# Patient Record
Sex: Male | Born: 1935 | Race: White | Hispanic: No | Marital: Married | Smoking: Former smoker
Health system: Southern US, Community
[De-identification: ages and names within clinical notes are randomized; demographics above are authoritative.]

## PROBLEM LIST (undated history)

## (undated) DIAGNOSIS — I340 Nonrheumatic mitral (valve) insufficiency: Secondary | ICD-10-CM

## (undated) DIAGNOSIS — N390 Urinary tract infection, site not specified: Secondary | ICD-10-CM

## (undated) DIAGNOSIS — A6920 Lyme disease, unspecified: Secondary | ICD-10-CM

## (undated) DIAGNOSIS — B019 Varicella without complication: Secondary | ICD-10-CM

## (undated) DIAGNOSIS — C4492 Squamous cell carcinoma of skin, unspecified: Secondary | ICD-10-CM

## (undated) DIAGNOSIS — Z9889 Other specified postprocedural states: Secondary | ICD-10-CM

## (undated) DIAGNOSIS — R32 Unspecified urinary incontinence: Secondary | ICD-10-CM

## (undated) DIAGNOSIS — C4491 Basal cell carcinoma of skin, unspecified: Secondary | ICD-10-CM

## (undated) DIAGNOSIS — C801 Malignant (primary) neoplasm, unspecified: Secondary | ICD-10-CM

## (undated) DIAGNOSIS — C61 Malignant neoplasm of prostate: Secondary | ICD-10-CM

## (undated) DIAGNOSIS — Z5189 Encounter for other specified aftercare: Secondary | ICD-10-CM

## (undated) HISTORY — DX: Squamous cell carcinoma of skin, unspecified: C44.92

## (undated) HISTORY — DX: Varicella without complication: B01.9

## (undated) HISTORY — DX: Lyme disease, unspecified: A69.20

## (undated) HISTORY — DX: Basal cell carcinoma of skin, unspecified: C44.91

## (undated) HISTORY — DX: Nonrheumatic mitral (valve) insufficiency: I34.0

## (undated) HISTORY — DX: Encounter for other specified aftercare: Z51.89

## (undated) HISTORY — DX: Unspecified urinary incontinence: R32

## (undated) HISTORY — DX: Urinary tract infection, site not specified: N39.0

## (undated) HISTORY — DX: Other specified postprocedural states: Z98.890

## (undated) HISTORY — DX: Malignant neoplasm of prostate: C61

## (undated) HISTORY — DX: Malignant (primary) neoplasm, unspecified: C80.1

---
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
## 1968-01-23 HISTORY — PX: INGUINAL HERNIA REPAIR: SUR1180

## 1978-01-22 HISTORY — PX: BLEPHAROPLASTY: SUR158

## 2010-01-22 DIAGNOSIS — A6920 Lyme disease, unspecified: Secondary | ICD-10-CM

## 2010-01-22 HISTORY — DX: Lyme disease, unspecified: A69.20

## 2011-03-20 ENCOUNTER — Emergency Department: Payer: Self-pay | Admitting: Emergency Medicine

## 2011-03-20 LAB — CBC
HCT: 25 % — ABNORMAL LOW (ref 40.0–52.0)
MCHC: 34.2 g/dL (ref 32.0–36.0)
MCV: 102 fL — ABNORMAL HIGH (ref 80–100)
RBC: 2.47 10*6/uL — ABNORMAL LOW (ref 4.40–5.90)
RDW: 14.2 % (ref 11.5–14.5)
WBC: 11 10*3/uL — ABNORMAL HIGH (ref 3.8–10.6)

## 2011-03-20 LAB — BASIC METABOLIC PANEL
Anion Gap: 9 (ref 7–16)
BUN: 27 mg/dL — ABNORMAL HIGH (ref 7–18)
Creatinine: 1.4 mg/dL — ABNORMAL HIGH (ref 0.60–1.30)
EGFR (Non-African Amer.): 51 — ABNORMAL LOW
Glucose: 136 mg/dL — ABNORMAL HIGH (ref 65–99)
Osmolality: 277 (ref 275–301)

## 2011-03-20 LAB — URINALYSIS, COMPLETE

## 2011-03-20 LAB — PROTIME-INR: INR: 1

## 2011-04-24 DIAGNOSIS — I059 Rheumatic mitral valve disease, unspecified: Secondary | ICD-10-CM

## 2011-04-24 DIAGNOSIS — D698 Other specified hemorrhagic conditions: Secondary | ICD-10-CM

## 2011-04-24 DIAGNOSIS — R413 Other amnesia: Secondary | ICD-10-CM

## 2011-06-05 DIAGNOSIS — R05 Cough: Secondary | ICD-10-CM

## 2011-06-05 DIAGNOSIS — I059 Rheumatic mitral valve disease, unspecified: Secondary | ICD-10-CM

## 2011-06-05 DIAGNOSIS — N3289 Other specified disorders of bladder: Secondary | ICD-10-CM

## 2011-10-06 ENCOUNTER — Emergency Department: Payer: Self-pay | Admitting: Emergency Medicine

## 2011-10-06 LAB — COMPREHENSIVE METABOLIC PANEL
Alkaline Phosphatase: 112 U/L (ref 50–136)
BUN: 37 mg/dL — ABNORMAL HIGH (ref 7–18)
Chloride: 104 mmol/L (ref 98–107)
Co2: 26 mmol/L (ref 21–32)
EGFR (African American): 31 — ABNORMAL LOW
EGFR (Non-African Amer.): 27 — ABNORMAL LOW
Glucose: 125 mg/dL — ABNORMAL HIGH (ref 65–99)
SGOT(AST): 44 U/L — ABNORMAL HIGH (ref 15–37)
SGPT (ALT): 24 U/L (ref 12–78)
Total Protein: 8.1 g/dL (ref 6.4–8.2)

## 2011-10-06 LAB — PROTIME-INR
INR: 1
Prothrombin Time: 13.4 secs (ref 11.5–14.7)

## 2011-10-06 LAB — MAGNESIUM: Magnesium: 1.9 mg/dL

## 2011-10-06 LAB — CBC
HGB: 13 g/dL (ref 13.0–18.0)
MCH: 31.4 pg (ref 26.0–34.0)
MCHC: 33.4 g/dL (ref 32.0–36.0)
MCV: 94 fL (ref 80–100)
Platelet: 266 10*3/uL (ref 150–440)
RBC: 4.13 10*6/uL — ABNORMAL LOW (ref 4.40–5.90)

## 2011-10-06 LAB — APTT: Activated PTT: 27.2 secs (ref 23.6–35.9)

## 2011-10-08 LAB — URINE CULTURE

## 2011-10-12 LAB — CULTURE, BLOOD (SINGLE)

## 2011-10-15 DIAGNOSIS — C61 Malignant neoplasm of prostate: Secondary | ICD-10-CM

## 2011-10-15 DIAGNOSIS — N139 Obstructive and reflux uropathy, unspecified: Secondary | ICD-10-CM

## 2011-10-15 DIAGNOSIS — A419 Sepsis, unspecified organism: Secondary | ICD-10-CM

## 2011-10-15 DIAGNOSIS — I059 Rheumatic mitral valve disease, unspecified: Secondary | ICD-10-CM

## 2011-11-07 ENCOUNTER — Ambulatory Visit: Payer: Self-pay | Admitting: Internal Medicine

## 2011-11-23 HISTORY — PX: CYSTECTOMY W/ URETEROILEAL CONDUIT: SUR361

## 2011-12-10 DIAGNOSIS — C61 Malignant neoplasm of prostate: Secondary | ICD-10-CM

## 2011-12-10 DIAGNOSIS — N139 Obstructive and reflux uropathy, unspecified: Secondary | ICD-10-CM

## 2011-12-10 DIAGNOSIS — Z45018 Encounter for adjustment and management of other part of cardiac pacemaker: Secondary | ICD-10-CM

## 2012-02-05 ENCOUNTER — Emergency Department: Payer: Self-pay | Admitting: Emergency Medicine

## 2012-07-03 ENCOUNTER — Inpatient Hospital Stay: Payer: Self-pay | Admitting: Internal Medicine

## 2012-07-03 LAB — COMPREHENSIVE METABOLIC PANEL
Albumin: 3 g/dL — ABNORMAL LOW (ref 3.4–5.0)
Alkaline Phosphatase: 65 U/L (ref 50–136)
BUN: 25 mg/dL — ABNORMAL HIGH (ref 7–18)
Bilirubin,Total: 0.6 mg/dL (ref 0.2–1.0)
Calcium, Total: 9 mg/dL (ref 8.5–10.1)
Chloride: 105 mmol/L (ref 98–107)
Co2: 24 mmol/L (ref 21–32)
EGFR (African American): >60
EGFR (Non-African Amer.): 59 — ABNORMAL LOW
Osmolality: 281 (ref 275–301)
SGOT(AST): 23 U/L (ref 15–37)
Total Protein: 6.8 g/dL (ref 6.4–8.2)

## 2012-07-03 LAB — URINALYSIS, COMPLETE
Glucose,UR: NEGATIVE mg/dL (ref 0–75)
Nitrite: NEGATIVE
Ph: 6 (ref 4.5–8.0)
WBC UR: 25 /HPF (ref 0–5)

## 2012-07-03 LAB — CBC
HGB: 11.4 g/dL — ABNORMAL LOW (ref 13.0–18.0)
MCH: 32.4 pg (ref 26.0–34.0)
MCHC: 33.9 g/dL (ref 32.0–36.0)
MCV: 96 fL (ref 80–100)
RBC: 3.52 10*6/uL — ABNORMAL LOW (ref 4.40–5.90)
RDW: 15 % — ABNORMAL HIGH (ref 11.5–14.5)

## 2012-07-04 LAB — BASIC METABOLIC PANEL
BUN: 20 mg/dL — ABNORMAL HIGH (ref 7–18)
Chloride: 111 mmol/L — ABNORMAL HIGH (ref 98–107)
Co2: 24 mmol/L (ref 21–32)
EGFR (African American): >60
EGFR (Non-African Amer.): >60
Osmolality: 286 (ref 275–301)
Potassium: 3.9 mmol/L (ref 3.5–5.1)

## 2012-07-04 LAB — CBC WITH DIFFERENTIAL/PLATELET
Basophil %: 0.2 %
Eosinophil #: 0 10*3/uL (ref 0.0–0.7)
Eosinophil %: 0.3 %
HCT: 29.9 % — ABNORMAL LOW (ref 40.0–52.0)
HGB: 10.5 g/dL — ABNORMAL LOW (ref 13.0–18.0)
Lymphocyte #: 1.3 10*3/uL (ref 1.0–3.6)
MCHC: 34.9 g/dL (ref 32.0–36.0)
Monocyte %: 11.3 %
Neutrophil #: 6.5 10*3/uL (ref 1.4–6.5)
Neutrophil %: 73.4 %
Platelet: 198 10*3/uL (ref 150–440)
WBC: 8.8 10*3/uL (ref 3.8–10.6)

## 2012-07-07 LAB — URINE CULTURE

## 2012-07-09 LAB — CULTURE, BLOOD (SINGLE)

## 2012-08-01 ENCOUNTER — Encounter: Payer: Self-pay | Admitting: Internal Medicine

## 2012-08-01 ENCOUNTER — Ambulatory Visit (INDEPENDENT_AMBULATORY_CARE_PROVIDER_SITE_OTHER): Payer: Medicare Other | Admitting: Internal Medicine

## 2012-08-01 VITALS — BP 108/60 | HR 79 | Temp 97.4°F | Ht 69.5 in | Wt 145.5 lb

## 2012-08-01 DIAGNOSIS — Z936 Other artificial openings of urinary tract status: Secondary | ICD-10-CM | POA: Insufficient documentation

## 2012-08-01 DIAGNOSIS — Z23 Encounter for immunization: Secondary | ICD-10-CM

## 2012-08-01 DIAGNOSIS — C801 Malignant (primary) neoplasm, unspecified: Secondary | ICD-10-CM

## 2012-08-01 DIAGNOSIS — Z87448 Personal history of other diseases of urinary system: Secondary | ICD-10-CM

## 2012-08-01 DIAGNOSIS — Z9889 Other specified postprocedural states: Secondary | ICD-10-CM

## 2012-08-01 DIAGNOSIS — C61 Malignant neoplasm of prostate: Secondary | ICD-10-CM

## 2012-08-01 DIAGNOSIS — I340 Nonrheumatic mitral (valve) insufficiency: Secondary | ICD-10-CM

## 2012-08-01 DIAGNOSIS — I059 Rheumatic mitral valve disease, unspecified: Secondary | ICD-10-CM

## 2012-08-01 NOTE — Assessment & Plan Note (Signed)
Sees oncologist at [facility] On med that does cause some fatigue ?bone mets in spine

## 2012-08-01 NOTE — Assessment & Plan Note (Signed)
No symptoms from this Has been well studied in the past at [facility]

## 2012-08-01 NOTE — Addendum Note (Signed)
Addended by: Consuello Masse on: 08/01/2012 03:50 PM   Modules accepted: Orders

## 2012-08-01 NOTE — Assessment & Plan Note (Signed)
Bladder removed after fulguration for severe bleeding He is doing well with care for this now Has had UTI though

## 2012-08-01 NOTE — Progress Notes (Signed)
Subjective:    Patient ID: Christopher Jacobson, male    DOB: 1924-06-23, 79 y.o.   MRN: [id number]  HPI Establishing here I did see while in health care last year  Recent UTI-- has urostomy  Prostate cancer treated with radiation therapy in past Was hospitalized with radiation cystits---had recurrent severe bleeding Required fulguration and finally bladder removal since it was effectively destroyed  Still on Rx for metastatic prostate cancer Dr Philomena Course at [facility] His only med is antiandrogen apparently   Known mitral regurgitation No chest pain No SOB Not much exercise though---tired out by the xtandi  No current outpatient prescriptions on file prior to visit.   No current facility-administered medications on file prior to visit.    Allergies  Allergen Reactions  . Codeine     Past Medical History  Diagnosis Date  . Cancer   . Blood transfusion without reported diagnosis   . Urinary incontinence   . Urinary tract infection   . Chicken pox   . Prostate cancer, primary, with metastasis from prostate to other site   . History of urostomy   . Mitral regurgitation     Past Surgical History  Procedure Laterality Date  . Tonsillectomy and adenoidectomy  1933  . Blepharoplasty Bilateral 1980  . Inguinal hernia repair Bilateral 1970  . Cystectomy w/ ureteroileal conduit N/A 11/13    [facility]    Family History  Problem Relation Age of Onset  . Alcohol abuse Father   . Heart disease Mother   . Diabetes Neg Hx   . Hypertension Neg Hx     History   Social History  . Marital Status: Married    Spouse Name: N/A    Number of Children: 4  . Years of Education: N/A   Occupational History  . Retired -- Secondary school teacher of Social work     [employer]   Social History Main Topics  . Smoking status: Former Smoker    Quit date: 01/23/1968  . Smokeless tobacco: Not on file  . Alcohol Use: Not on file  . Drug Use: Not on file  . Sexually Active: Not on file   Other Topics Concern  .  Not on file   Social History Narrative   Has living will   Son Casimiro Needle has health care POA   Has DNR order already   No tube feeds if cognitively unaware   Review of Systems  Constitutional: Positive for fatigue. Negative for unexpected weight change.       Weight going up slightly  HENT: Positive for hearing loss.        Uses hearing aides Good teeth---regular with dentist  Eyes: Negative for visual disturbance.       Has cataracts---haven't needed repair  Respiratory: Negative for chest tightness and shortness of breath.   Cardiovascular: Negative for chest pain and palpitations.  Gastrointestinal: Negative for nausea and blood in stool.       Bowels different since the urostomy  Genitourinary: Negative for dysuria and hematuria.  Musculoskeletal: Negative for back pain and arthralgias.  Neurological: Negative for dizziness, syncope and light-headedness.  Psychiatric/Behavioral: Positive for dysphoric mood. Negative for sleep disturbance. The patient is not nervous/anxious.        Some depressed mood related to his multiple medical stressors and decreased function       Objective:   Physical Exam  Constitutional: He appears well-developed. No distress.  Neck: Normal range of motion. Neck supple. No thyromegaly present.  Cardiovascular: Normal rate, regular  rhythm and intact distal pulses.  Exam reveals no gallop.   Murmur heard. Gr 3/6 blowing systolic murmur at apex--referred to base also  Pulmonary/Chest: Effort normal and breath sounds normal. No respiratory distress. He has no wheezes. He has no rales.  Abdominal: Soft. There is no tenderness.  Musculoskeletal: He exhibits no edema and no tenderness.  Lymphadenopathy:    He has no cervical adenopathy.  Psychiatric: He has a normal mood and affect. His behavior is normal.          Assessment & Plan:

## 2012-08-08 ENCOUNTER — Encounter: Payer: Self-pay | Admitting: Dermatology

## 2012-08-20 ENCOUNTER — Encounter: Payer: Self-pay | Admitting: Internal Medicine

## 2012-10-06 ENCOUNTER — Telehealth: Payer: Self-pay

## 2012-10-06 NOTE — Telephone Encounter (Signed)
Pt said [facility] urologist wants pt to start taking probiotic. Pt request Dr Karle Starch opinion if pt should take probiotic; pt was looking on internet and probiotic can cause bleeding and flatus. Pt request cb [phone].

## 2012-10-06 NOTE — Telephone Encounter (Signed)
They usually help those conditions I think it is okay for him to try a probiotic (like Align) Can also try activia yogurt daily ( or many other yogurts with live cultures)

## 2012-10-07 NOTE — Telephone Encounter (Signed)
No answer, phone just rang, no answering machine, will try to call again later

## 2012-10-09 NOTE — Telephone Encounter (Signed)
Left message with male that answered to have pt return my call

## 2012-10-09 NOTE — Telephone Encounter (Signed)
Spoke with patient and advised results He will check with the pharmacy

## 2012-11-27 ENCOUNTER — Telehealth: Payer: Self-pay | Admitting: *Deleted

## 2012-11-27 NOTE — Telephone Encounter (Signed)
Pt callling asking if it would be ok for him and his wife to take a cruise in December for 2 weeks? Pt is concerned about his urostomy bag. I offered an appt to discuss but he just want Dr. Alphonsus Sias to call him. Per pt he was very upset that he couldn't talk to [facility] himself.

## 2012-11-27 NOTE — Telephone Encounter (Signed)
Spoke to wife Fine to go on cruise---make sure he has extra bags, just in case  Told her to have him call me at the office if he has questions, not at health care

## 2012-12-24 ENCOUNTER — Ambulatory Visit (INDEPENDENT_AMBULATORY_CARE_PROVIDER_SITE_OTHER)
Admission: RE | Admit: 2012-12-24 | Discharge: 2012-12-24 | Disposition: A | Discharge status: Home / Self Care | Payer: Medicare Other | Source: Ambulatory Visit | Attending: Internal Medicine | Admitting: Internal Medicine

## 2012-12-24 ENCOUNTER — Ambulatory Visit: Admission: RE | Admit: 2012-12-24 | Payer: Medicare Other | Source: Ambulatory Visit

## 2012-12-24 ENCOUNTER — Ambulatory Visit (INDEPENDENT_AMBULATORY_CARE_PROVIDER_SITE_OTHER): Payer: Medicare Other | Admitting: Internal Medicine

## 2012-12-24 ENCOUNTER — Encounter: Payer: Self-pay | Admitting: Internal Medicine

## 2012-12-24 VITALS — BP 100/60 | HR 80 | Temp 97.8°F | Wt 150.8 lb

## 2012-12-24 DIAGNOSIS — R05 Cough: Secondary | ICD-10-CM | POA: Insufficient documentation

## 2012-12-24 DIAGNOSIS — R5383 Other fatigue: Secondary | ICD-10-CM | POA: Insufficient documentation

## 2012-12-24 DIAGNOSIS — C801 Malignant (primary) neoplasm, unspecified: Secondary | ICD-10-CM

## 2012-12-24 DIAGNOSIS — C61 Malignant neoplasm of prostate: Secondary | ICD-10-CM

## 2012-12-24 DIAGNOSIS — R5381 Other malaise: Secondary | ICD-10-CM

## 2012-12-24 LAB — CBC WITH DIFFERENTIAL/PLATELET
Basophils Absolute: 0 10*3/uL (ref 0.0–0.1)
Eosinophils Absolute: 0 10*3/uL (ref 0.0–0.7)
HCT: 37.7 % — ABNORMAL LOW (ref 39.0–52.0)
Hemoglobin: 12.7 g/dL — ABNORMAL LOW (ref 13.0–17.0)
Lymphocytes Relative: 21.5 % (ref 12.0–46.0)
Lymphs Abs: 1.6 10*3/uL (ref 0.7–4.0)
MCHC: 33.8 g/dL (ref 30.0–36.0)
Neutro Abs: 4.7 10*3/uL (ref 1.4–7.7)
Platelets: 236 10*3/uL (ref 150.0–400.0)
RDW: 16.2 % — ABNORMAL HIGH (ref 11.5–14.6)

## 2012-12-24 LAB — HEPATIC FUNCTION PANEL
ALT: 16 U/L (ref 0–53)
AST: 32 U/L (ref 0–37)
Albumin: 3.7 g/dL (ref 3.5–5.2)
Alkaline Phosphatase: 64 U/L (ref 39–117)
Bilirubin, Direct: 0.1 mg/dL (ref 0.0–0.3)
Total Bilirubin: 0.6 mg/dL (ref 0.3–1.2)
Total Protein: 7.2 g/dL (ref 6.0–8.3)

## 2012-12-24 LAB — BASIC METABOLIC PANEL
BUN: 22 mg/dL (ref 6–23)
CO2: 26 mEq/L (ref 19–32)
Calcium: 9.2 mg/dL (ref 8.4–10.5)
Glucose, Bld: 74 mg/dL (ref 70–99)
Sodium: 138 mEq/L (ref 135–145)

## 2012-12-24 LAB — PSA: PSA: 0.18 ng/mL (ref 0.10–4.00)

## 2012-12-24 LAB — SEDIMENTATION RATE: Sed Rate: 67 mm/h — ABNORMAL HIGH (ref 0–22)

## 2012-12-24 LAB — TSH: TSH: 4.25 u[IU]/mL (ref 0.35–5.50)

## 2012-12-24 MED ORDER — AMOXICILLIN-POT CLAVULANATE 875-125 MG PO TABS: 1.0000 | ORAL_TABLET | Freq: Two times a day (BID) | ORAL | Status: DC

## 2012-12-24 NOTE — Assessment & Plan Note (Addendum)
Probably related to his respiratory infection Will check labs Check PSA---was last 0.5 Don't think he has UTI  May need to write letter to get him out of his cruise for medical reasons

## 2012-12-24 NOTE — Progress Notes (Signed)
Pre-visit discussion using our clinic review tool. No additional management support is needed unless otherwise documented below in the visit note.  

## 2012-12-24 NOTE — Progress Notes (Signed)
   Subjective:    Patient ID: Christopher Jacobson, male    DOB: October 05, 1924, 79 y.o.   MRN: [id number]  HPI Here with wife and son Casimiro Needle  Doesn't feel good Yesterday, awoke and couldn't get up out of bed Sleeping a lot Still feels weak Able to get up this AM and able to fix breakfast  Has had runny nose for a week Slight dry cough No fever. No night sweats or chills  No urinary changes Usual output Has decreased his fluid intake lately  No rash or new skin problems  Signed up for 2 week [place] cruise--starting this weekend  Current Outpatient Prescriptions on File Prior to Visit  Medication Sig Dispense Refill  . enzalutamide (XTANDI) 40 MG capsule Take 120 mg by mouth daily.       No current facility-administered medications on file prior to visit.    Allergies  Allergen Reactions  . Codeine     Past Medical History  Diagnosis Date  . Cancer   . Blood transfusion without reported diagnosis   . Urinary incontinence   . Urinary tract infection   . Chicken pox   . Prostate cancer, primary, with metastasis from prostate to other site   . History of urostomy   . Mitral regurgitation   . Squamous cell skin cancer, multiple sites   . Basal cell carcinoma   . Lyme disease 2012    Past Surgical History  Procedure Laterality Date  . Tonsillectomy and adenoidectomy  1933  . Blepharoplasty Bilateral 1980  . Inguinal hernia repair Bilateral 1970  . Cystectomy w/ ureteroileal conduit N/A 11/13    [facility]    Family History  Problem Relation Age of Onset  . Alcohol abuse Father   . Heart disease Mother   . Diabetes Neg Hx   . Hypertension Neg Hx     History   Social History  . Marital Status: Married    Spouse Name: N/A    Number of Children: 4  . Years of Education: N/A   Occupational History  . Retired -- Secondary school teacher of Social work     [employer]   Social History Main Topics  . Smoking status: Former Smoker    Quit date: 01/23/1968  . Smokeless tobacco:  Never Used  . Alcohol Use: No  . Drug Use: No  . Sexual Activity: Not on file   Other Topics Concern  . Not on file   Social History Narrative   Has living will   Son Casimiro Needle has health care POA   Has DNR order already   No tube feeds if cognitively unaware   Review of Systems Feels he sleeps well Appetite is fine No nausea or vomiting    Objective:   Physical Exam  Constitutional: He appears well-developed. No distress.  HENT:  Mouth/Throat: Oropharynx is clear and moist. No oropharyngeal exudate.  Neck: Normal range of motion. Neck supple. No thyromegaly present.  Pulmonary/Chest: Effort normal. No respiratory distress. He has no wheezes. He has rales.  LLL crackles No dullness  Abdominal: Soft. Bowel sounds are normal. He exhibits no distension. There is no tenderness. There is no rebound and no guarding.  Urine looks normal in the urostomy  Musculoskeletal: He exhibits no edema and no tenderness.  Lymphadenopathy:    He has no cervical adenopathy.          Assessment & Plan:

## 2012-12-24 NOTE — Patient Instructions (Signed)
Call me if you are not feeling better within the next few days.

## 2012-12-24 NOTE — Assessment & Plan Note (Signed)
Has fatigue and feels weak CXR without clear cut pneumonia to my reading ---but may have increased lower lung findings Will treat with augmentin empirically

## 2013-02-02 ENCOUNTER — Ambulatory Visit: Payer: Medicare Other | Admitting: Internal Medicine

## 2013-03-03 IMAGING — CT CT HEAD WITHOUT CONTRAST
1 series · 16 of 30 positions shown, 20 images · non-contrast
Comparison: none

REASON FOR EXAM: found on floor, fever
COMMENTS:

PROCEDURE:     CT  - CT HEAD WITHOUT CONTRAST  - October 06, 2011 [DATE]
RESULT:     Comparison:  None
TECHNIQUE: Multiple axial images from the foramen magnum to the vertex were
obtained without IV contrast.

[Series 2: soft tissue · axial · 0.44mm/px · z∈[+1326,+1470]mm · 16 of 33 slices shown, 20 images]
[im 2/33  brain]
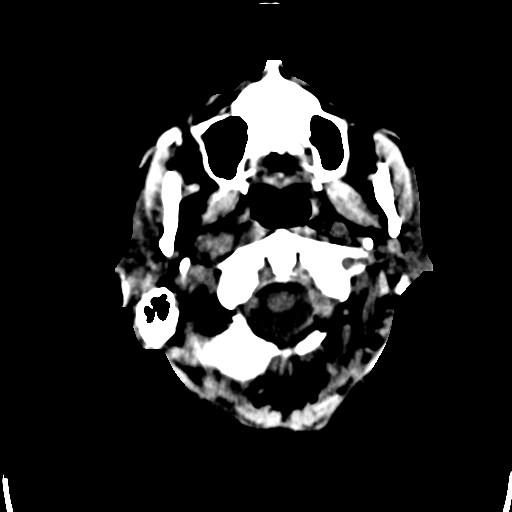
[im 2/33  bone]
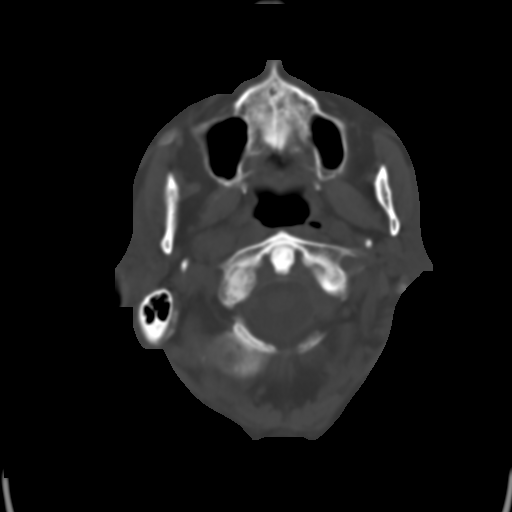
[im 4/33  brain]
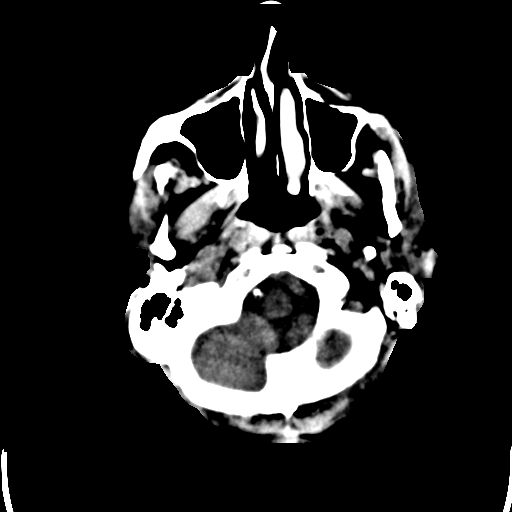
[im 6/33  brain]
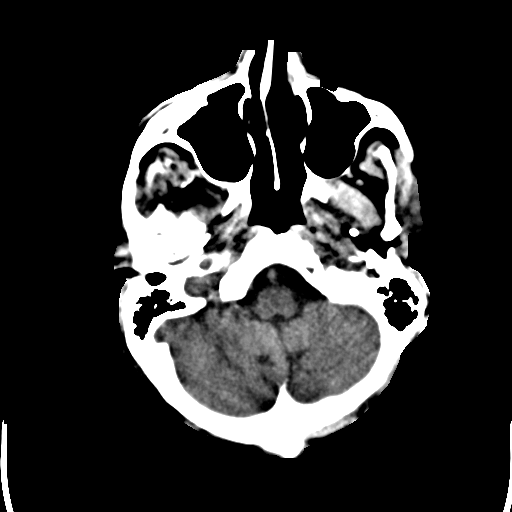
[im 8/33  brain]
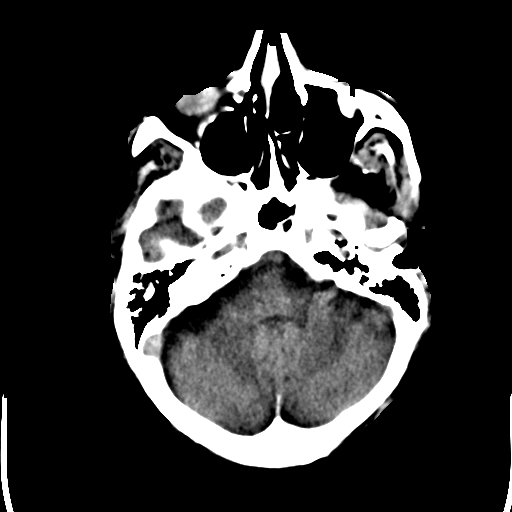
[im 9/33  brain]
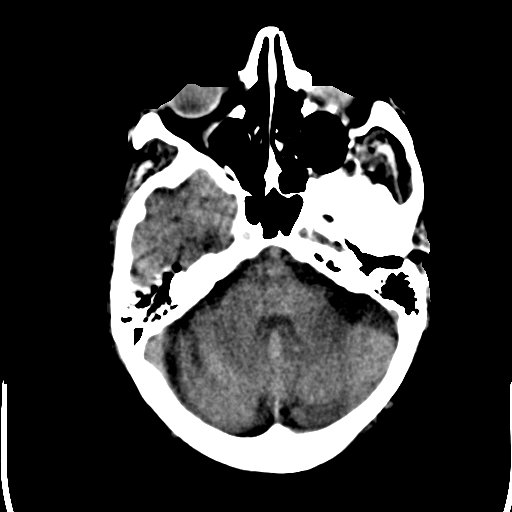
[im 9/33  bone]
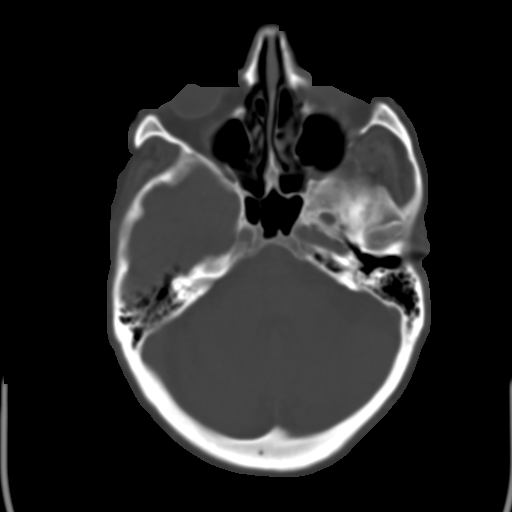
[im 12/33  brain]
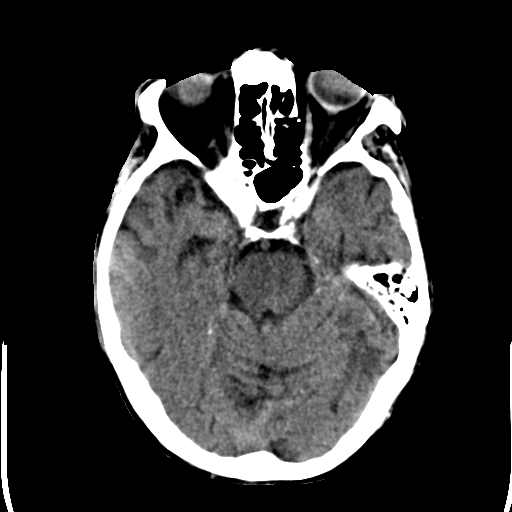
[im 14/33  brain]
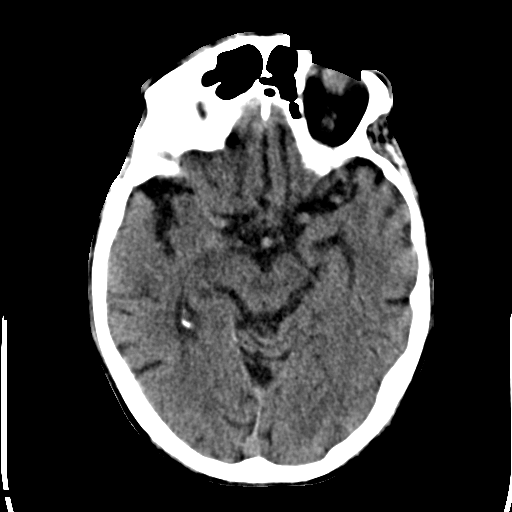
[im 16/33  brain]
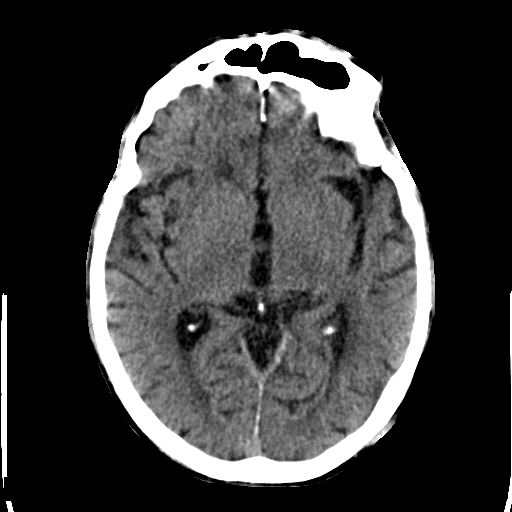
[im 17/33  brain]
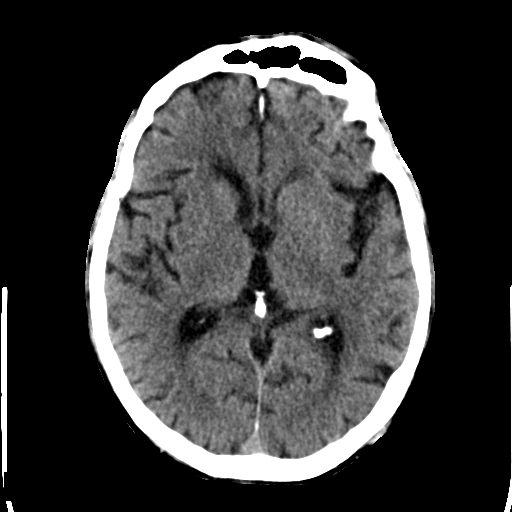
[im 17/33  bone]
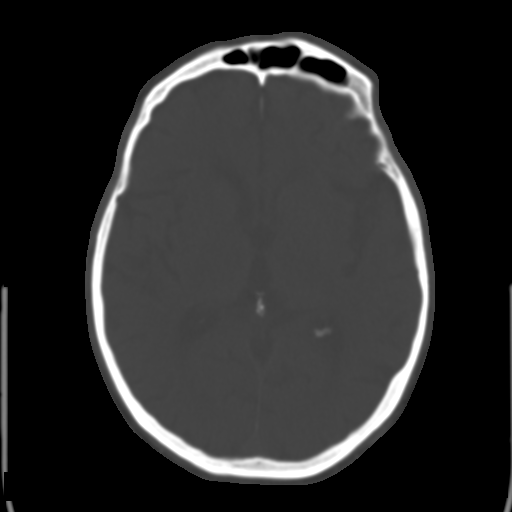
[im 19/33  brain]
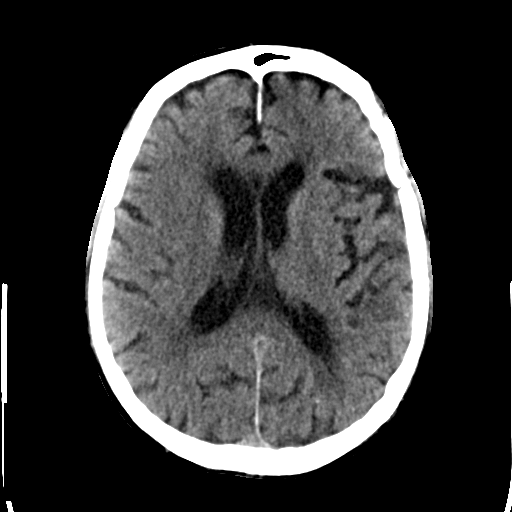
[im 21/33  brain]
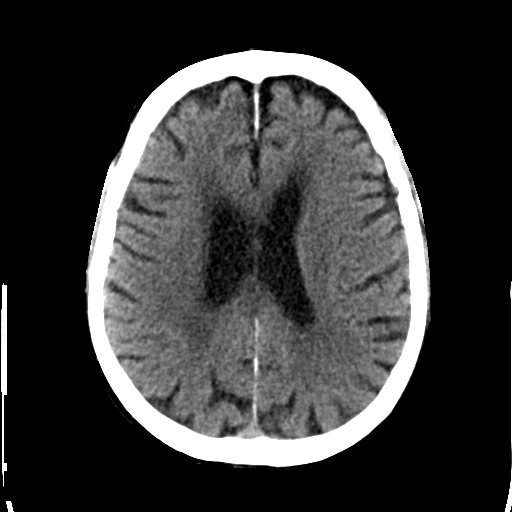
[im 24/33  brain]
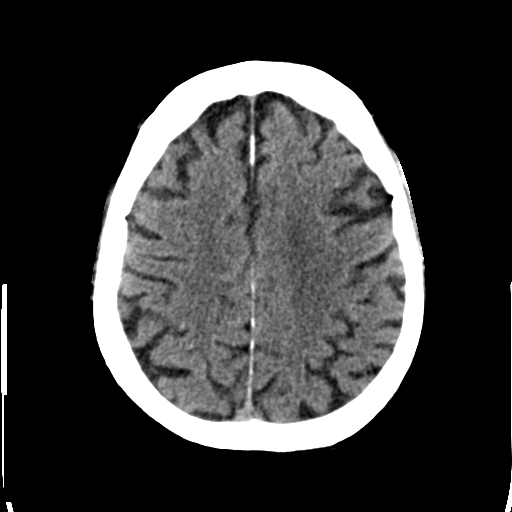
[im 25/33  brain]
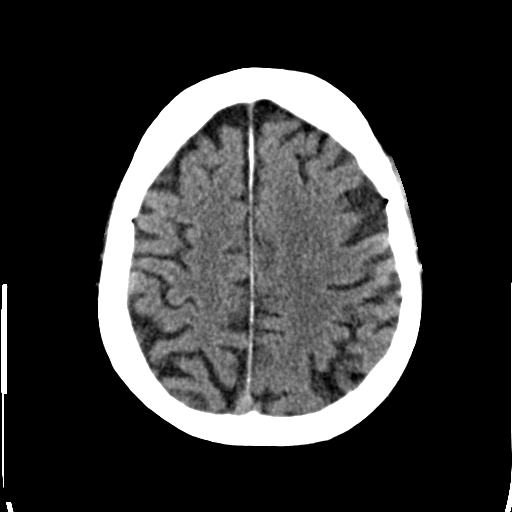
[im 25/33  bone]
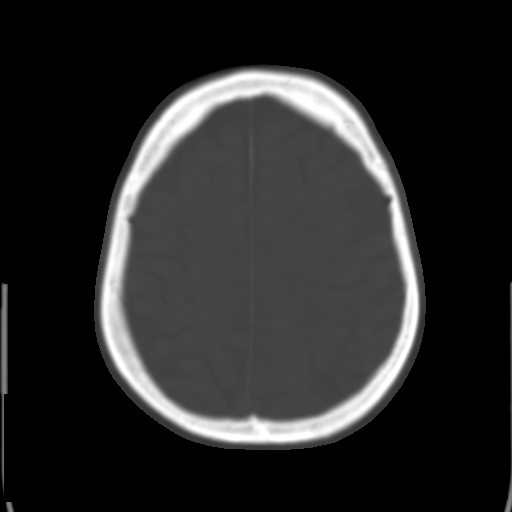
[im 27/33  brain]
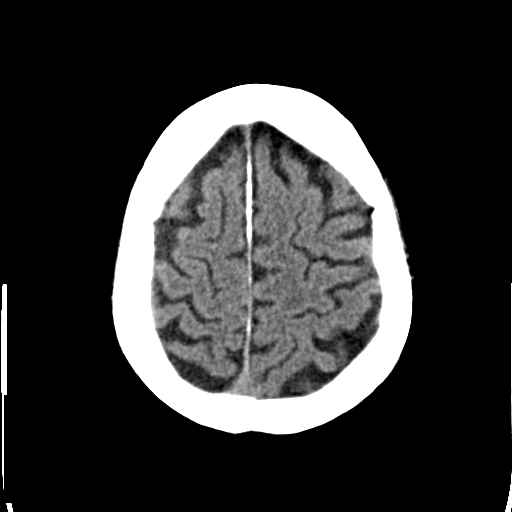
[im 29/33  brain]
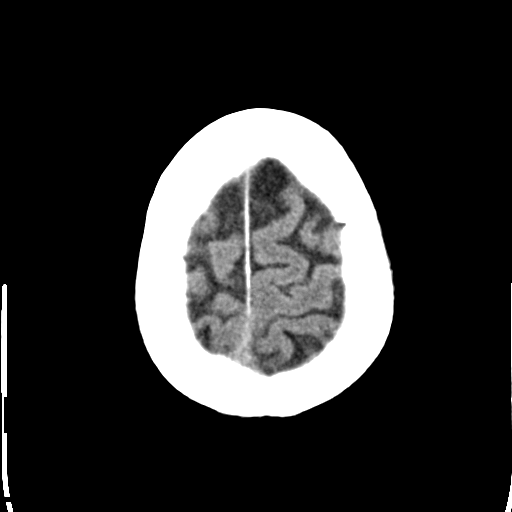
[im 31/33  brain]
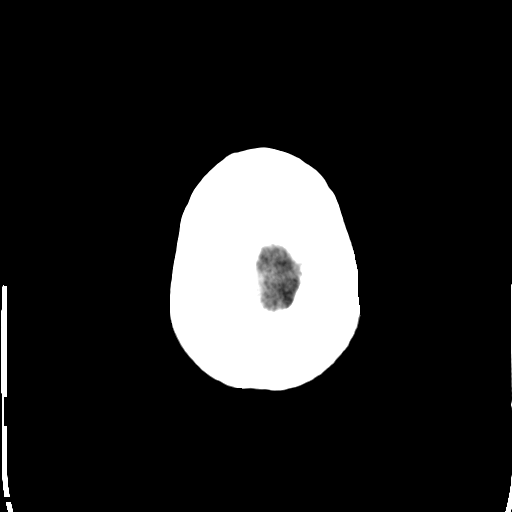

[16 of 30 positions shown; findings below may reference images not displayed]

FINDINGS: There is no evidence for mass effect, midline shift, or extra-axial fluid
collections. There is no evidence for space-occupying lesion, intracranial
hemorrhage, or cortical-based area of infarction. Periventricular and
subcortical hypoattenuation is consistent with chronic small vessel ischemic
disease.

There is a small mucous retention cyst in the left maxillary sinus. There is
a trace amount of fluid in the right maxillary sinus. This is nonspecific.

The osseous structures are unremarkable.
IMPRESSION: 1. No acute intracranial process.
2. Chronic small vessel ischemic disease.

[REDACTED]

## 2013-03-09 ENCOUNTER — Ambulatory Visit: Payer: Medicare Other | Admitting: Internal Medicine

## 2013-03-12 ENCOUNTER — Encounter: Payer: Self-pay | Admitting: Internal Medicine

## 2013-03-12 ENCOUNTER — Ambulatory Visit (INDEPENDENT_AMBULATORY_CARE_PROVIDER_SITE_OTHER): Payer: Medicare Other | Admitting: Internal Medicine

## 2013-03-12 VITALS — BP 110/68 | HR 84 | Temp 98.0°F | Wt 156.0 lb

## 2013-03-12 DIAGNOSIS — Z9889 Other specified postprocedural states: Secondary | ICD-10-CM

## 2013-03-12 DIAGNOSIS — C61 Malignant neoplasm of prostate: Secondary | ICD-10-CM

## 2013-03-12 DIAGNOSIS — R5381 Other malaise: Secondary | ICD-10-CM

## 2013-03-12 DIAGNOSIS — I059 Rheumatic mitral valve disease, unspecified: Secondary | ICD-10-CM

## 2013-03-12 DIAGNOSIS — R5383 Other fatigue: Secondary | ICD-10-CM

## 2013-03-12 DIAGNOSIS — I340 Nonrheumatic mitral (valve) insufficiency: Secondary | ICD-10-CM

## 2013-03-12 NOTE — Assessment & Plan Note (Signed)
PSA was at 5.3--now consistently down 0.2-0.3 or so congratulations

## 2013-03-12 NOTE — Assessment & Plan Note (Signed)
No apparent symptoms 

## 2013-03-12 NOTE — Progress Notes (Signed)
Pre visit review using our clinic review tool, if applicable. No additional management support is needed unless otherwise documented below in the visit note. 

## 2013-03-12 NOTE — Progress Notes (Signed)
   Subjective:    Patient ID: Christopher Jacobson, male    DOB: Mar 15, 1924, 80 y.o.   MRN: [id number]  HPI Here with wife  Goes to [facility] about every 5 weeks PSA has been low and stable Continues on Xtandi  No problems with the urostomy No infections  Appetite is good Weight is up 6# Not doing exercise Feels weak   Current Outpatient Prescriptions on File Prior to Visit  Medication Sig Dispense Refill  . enzalutamide (XTANDI) 40 MG capsule Take 120 mg by mouth daily.      . Probiotic Product (PROBIOTIC DAILY PO) Take by mouth daily.       No current facility-administered medications on file prior to visit.    Allergies  Allergen Reactions  . Codeine     Past Medical History  Diagnosis Date  . Cancer   . Blood transfusion without reported diagnosis   . Urinary incontinence   . Urinary tract infection   . Chicken pox   . Prostate cancer, primary, with metastasis from prostate to other site   . History of urostomy   . Mitral regurgitation   . Squamous cell skin cancer, multiple sites   . Basal cell carcinoma   . Lyme disease 2012    Past Surgical History  Procedure Laterality Date  . Tonsillectomy and adenoidectomy  1933  . Blepharoplasty Bilateral 1980  . Inguinal hernia repair Bilateral 1970  . Cystectomy w/ ureteroileal conduit N/A 11/13    [facility]    Family History  Problem Relation Age of Onset  . Alcohol abuse Father   . Heart disease Mother   . Diabetes Neg Hx   . Hypertension Neg Hx     History   Social History  . Marital Status: Married    Spouse Name: N/A    Number of Children: 4  . Years of Education: N/A   Occupational History  . Retired -- Art therapist of Social work     [employer]   Social History Main Topics  . Smoking status: Former Smoker    Quit date: 01/23/1968  . Smokeless tobacco: Never Used  . Alcohol Use: No  . Drug Use: No  . Sexual Activity: Not on file   Other Topics Concern  . Not on file   Social History Narrative   Has  living will   Son Legrand Como has health care POA   Has DNR order already   No tube feeds if cognitively unaware   Review of Systems Did fall once walking the dog--some instability Sleeps well    Objective:   Physical Exam  Constitutional: He appears well-developed. No distress.  Neck: Normal range of motion. Neck supple. No thyromegaly present.  Cardiovascular: Normal rate and regular rhythm.  Exam reveals no gallop.   Murmur heard. Gr 3/6 mitral systolic murmur  Pulmonary/Chest: Effort normal and breath sounds normal. No respiratory distress. He has no wheezes. He has no rales.  Musculoskeletal: He exhibits no edema.  Lymphadenopathy:    He has no cervical adenopathy.  Neurological:  Mild generalized weakness Walks independently with cane  Psychiatric: He has a normal mood and affect. His behavior is normal.          Assessment & Plan:

## 2013-03-12 NOTE — Assessment & Plan Note (Signed)
Managing well with this

## 2013-03-12 NOTE — Assessment & Plan Note (Signed)
This persists but better I have recommended he restart his weight and aerobic exercise

## 2013-05-12 ENCOUNTER — Telehealth: Payer: Self-pay

## 2013-05-12 NOTE — Telephone Encounter (Signed)
Form on your desk  

## 2013-05-12 NOTE — Telephone Encounter (Signed)
Form done No charge 

## 2013-05-12 NOTE — Telephone Encounter (Signed)
I notified patient form is ready.  Patient asked for form to be mailed to him.  Form mailed.

## 2013-05-12 NOTE — Telephone Encounter (Signed)
Pt left v/m requesting handicap placard form to be filled out. Call pt when ready for pick up.

## 2013-07-20 ENCOUNTER — Telehealth: Payer: Self-pay | Admitting: Internal Medicine

## 2013-07-20 ENCOUNTER — Telehealth: Payer: Self-pay | Admitting: *Deleted

## 2013-07-20 ENCOUNTER — Ambulatory Visit: Payer: Self-pay | Admitting: Family Medicine

## 2013-07-20 NOTE — Telephone Encounter (Signed)
I called the pt and advised him per Dr Maudie Mercury he should see his PCP or his urologist due to his symptoms as they could treat his urine problems better and as this has been going on for several months. He stated he would call their office and I cancelled the appt with Dr Maudie Mercury.

## 2013-07-20 NOTE — Telephone Encounter (Signed)
Make sure they can travel all that way

## 2013-07-20 NOTE — Telephone Encounter (Signed)
Patient Information:  Caller Name: Hamdi  Phone: [phone]  Patient: Christopher Jacobson, Christopher Jacobson  Gender: Male  DOB: 1924-08-21  Age: 80 Years  PCP: Viviana Simpler [facility])  Office Follow Up:  Does the office need to follow up with this patient?: No  Instructions For The Office: N/A   Symptoms  Reason For Call & Symptoms: Pt is calling and states he is feeling very tired and weak and states that sx started several months ago and feels that it is due to cancer medication; but requesting a UA  because sx are worse today than normally are; sx include fatigue and generalized weakness and feeling "wobbly" on feet; pt has a urostomy by hx  Reviewed Health History In EMR: Yes  Reviewed Medications In EMR: Yes  Reviewed Allergies In EMR: Yes  Reviewed Surgeries / Procedures: Yes  Date of Onset of Symptoms: 07/20/2013  Guideline(s) Used:  Weakness (Generalized) and Fatigue  Disposition Per Guideline:   Go to Office Now  Reason For Disposition Reached:   Moderate weakness (i.e., interferes with work, school, normal activities) and cause unknown  Advice Given:  Call Back If:  You become worse.  Patient Will Follow Care Advice:  YES  Appointment Scheduled:  07/20/2013 14:15:00 Appointment Scheduled Provider:  Other Dr Maudie Mercury at [place] due to no appt at [facility]

## 2013-07-20 NOTE — Telephone Encounter (Signed)
Please check on him tomorrow 

## 2013-07-21 NOTE — Telephone Encounter (Signed)
appt was canceled at [place], made pt appt for tomorrow with Dr. Silvio Pate

## 2013-07-22 ENCOUNTER — Encounter: Payer: Self-pay | Admitting: Internal Medicine

## 2013-07-22 ENCOUNTER — Ambulatory Visit (INDEPENDENT_AMBULATORY_CARE_PROVIDER_SITE_OTHER): Payer: Medicare Other | Admitting: Internal Medicine

## 2013-07-22 VITALS — BP 110/70 | HR 73 | Temp 98.0°F | Wt 155.0 lb

## 2013-07-22 DIAGNOSIS — Z9889 Other specified postprocedural states: Secondary | ICD-10-CM

## 2013-07-22 DIAGNOSIS — R5383 Other fatigue: Secondary | ICD-10-CM

## 2013-07-22 DIAGNOSIS — C801 Malignant (primary) neoplasm, unspecified: Secondary | ICD-10-CM

## 2013-07-22 DIAGNOSIS — R531 Weakness: Secondary | ICD-10-CM

## 2013-07-22 DIAGNOSIS — C61 Malignant neoplasm of prostate: Secondary | ICD-10-CM

## 2013-07-22 DIAGNOSIS — R5381 Other malaise: Secondary | ICD-10-CM

## 2013-07-22 DIAGNOSIS — R300 Dysuria: Secondary | ICD-10-CM

## 2013-07-22 LAB — POCT URINALYSIS DIPSTICK
BILIRUBIN UA: NEGATIVE
Blood, UA: NEGATIVE
Glucose, UA: NEGATIVE
Ketones, UA: NEGATIVE
Nitrite, UA: POSITIVE
Spec Grav, UA: <=1.005
Urobilinogen, UA: NEGATIVE
pH, UA: 7.5

## 2013-07-22 LAB — COMPREHENSIVE METABOLIC PANEL
ALBUMIN: 3.8 g/dL (ref 3.5–5.2)
ALT: 14 U/L (ref 0–53)
AST: 26 U/L (ref 0–37)
Alkaline Phosphatase: 73 U/L (ref 39–117)
BUN: 26 mg/dL — AB (ref 6–23)
CALCIUM: 9.6 mg/dL (ref 8.4–10.5)
CO2: 31 mEq/L (ref 19–32)
CREATININE: 1.2 mg/dL (ref 0.4–1.5)
Chloride: 101 mEq/L (ref 96–112)
GFR: 62.62 mL/min (ref >60.00)
Glucose, Bld: 77 mg/dL (ref 70–99)
Potassium: 4.6 mEq/L (ref 3.5–5.1)
Sodium: 136 mEq/L (ref 135–145)
Total Bilirubin: 0.6 mg/dL (ref 0.2–1.2)
Total Protein: 7.6 g/dL (ref 6.0–8.3)

## 2013-07-22 LAB — CBC WITH DIFFERENTIAL/PLATELET
BASOS PCT: 0.4 % (ref 0.0–3.0)
Basophils Absolute: 0 10*3/uL (ref 0.0–0.1)
EOS PCT: 1.2 % (ref 0.0–5.0)
Eosinophils Absolute: 0.1 10*3/uL (ref 0.0–0.7)
HCT: 40.1 % (ref 39.0–52.0)
Hemoglobin: 13.3 g/dL (ref 13.0–17.0)
LYMPHS PCT: 23.3 % (ref 12.0–46.0)
Lymphs Abs: 1.9 10*3/uL (ref 0.7–4.0)
MCHC: 33 g/dL (ref 30.0–36.0)
MCV: 99.3 fl (ref 78.0–100.0)
MONOS PCT: 11.7 % (ref 3.0–12.0)
Monocytes Absolute: 1 10*3/uL (ref 0.1–1.0)
NEUTROS PCT: 63.4 % (ref 43.0–77.0)
Neutro Abs: 5.2 10*3/uL (ref 1.4–7.7)
Platelets: 254 10*3/uL (ref 150.0–400.0)
RBC: 4.04 Mil/uL — AB (ref 4.22–5.81)
RDW: 15.7 % — ABNORMAL HIGH (ref 11.5–15.5)
WBC: 8.2 10*3/uL (ref 4.0–10.5)

## 2013-07-22 LAB — TSH: TSH: 1.75 u[IU]/mL (ref 0.35–4.50)

## 2013-07-22 LAB — T4, FREE: Free T4: 0.82 ng/dL (ref 0.60–1.60)

## 2013-07-22 NOTE — Assessment & Plan Note (Signed)
Don't expect normal urine and it does have some nitrite and leukocytes Will send urine culture in case he worsens---but I don't think he has clinical signs of an infection

## 2013-07-22 NOTE — Addendum Note (Signed)
Addended by: Despina Hidden on: 07/22/2013 12:12 PM   Modules accepted: Orders

## 2013-07-22 NOTE — Progress Notes (Signed)
Pre visit review using our clinic review tool, if applicable. No additional management support is needed unless otherwise documented below in the visit note. 

## 2013-07-22 NOTE — Assessment & Plan Note (Signed)
PSA is still low!

## 2013-07-22 NOTE — Progress Notes (Signed)
Subjective:    Patient ID: Christopher Jacobson, male    DOB: [birth year], 80 y.o.   MRN: [id number]  HPI Here with wife  Not feeling well-- has fallen a couple of times (no sig injury fortunately) Thinks he just lost balance Hit head once and still has some neck soreness (about 3 weeks ago) 2nd was in shower-- felt dizzy and started to fall. Was able to grab the spigot and grab bar--- still went down but not as hard. Needed help to get up (wife). This was last week  Saw his oncologist at [facility] last week Did have PSA done-- 0.3  Feels weak and dizzy regularly Gets around with walker okay--but prefers the cane ("so I don't give in")  No fever No chest pain No SOB No change in urine in bag  Current Outpatient Prescriptions on File Prior to Visit  Medication Sig Dispense Refill  . enzalutamide (XTANDI) 40 MG capsule Take 120 mg by mouth daily.      . Probiotic Product (PROBIOTIC DAILY PO) Take by mouth daily.       No current facility-administered medications on file prior to visit.    Allergies  Allergen Reactions  . Codeine     Past Medical History  Diagnosis Date  . Cancer   . Blood transfusion without reported diagnosis   . Urinary incontinence   . Urinary tract infection   . Chicken pox   . Prostate cancer, primary, with metastasis from prostate to other site   . History of urostomy   . Mitral regurgitation   . Squamous cell skin cancer, multiple sites   . Basal cell carcinoma   . Lyme disease 2012    Past Surgical History  Procedure Laterality Date  . Tonsillectomy and adenoidectomy  1933  . Blepharoplasty Bilateral 1980  . Inguinal hernia repair Bilateral 1970  . Cystectomy w/ ureteroileal conduit N/A 11/13    [facility]    Family History  Problem Relation Age of Onset  . Alcohol abuse Father   . Heart disease Mother   . Diabetes Neg Hx   . Hypertension Neg Hx     History   Social History  . Marital Status: Married    Spouse Name: N/A    Number of  Children: 4  . Years of Education: N/A   Occupational History  . Retired -- Art therapist of Social work     [employer]   Social History Main Topics  . Smoking status: Former Smoker    Quit date: 01/23/1968  . Smokeless tobacco: Never Used  . Alcohol Use: No  . Drug Use: No  . Sexual Activity: Not on file   Other Topics Concern  . Not on file   Social History Narrative   Has living will   Son Legrand Como has health care POA   Has DNR order already   No tube feeds if cognitively unaware   Review of Systems Plans to get alert button to wear Appetite is okay Weight stable Sleeping well    Objective:   Physical Exam  Constitutional: He appears well-developed. No distress.  Neck: Normal range of motion. Neck supple. No thyromegaly present.  Cardiovascular: Normal rate and regular rhythm.  Exam reveals no gallop.   Gr 3/6 systolic murmur  Pulmonary/Chest: Effort normal and breath sounds normal. No respiratory distress. He has no wheezes. He has no rales.  Abdominal: Soft. There is no tenderness.  Slightly cloudy urine--nothing worrisome  Musculoskeletal: He exhibits no edema and no  tenderness.  Lymphadenopathy:    He has no cervical adenopathy.  Neurological:  Some bradykinesia and slowed speech No tremor  Psychiatric: He has a normal mood and affect. His behavior is normal.          Assessment & Plan:

## 2013-07-22 NOTE — Assessment & Plan Note (Signed)
With 2 recent falls Dr Christopher Jacobson has referred him for PT Will check labs They will have aide come in to help with bathing twice a week now

## 2013-07-22 NOTE — Addendum Note (Signed)
Addended by: Despina Hidden on: 07/22/2013 12:14 PM   Modules accepted: Orders

## 2013-07-23 ENCOUNTER — Encounter: Payer: Self-pay | Admitting: *Deleted

## 2013-07-25 LAB — URINE CULTURE: Colony Count: >=100000

## 2013-09-07 ENCOUNTER — Ambulatory Visit (INDEPENDENT_AMBULATORY_CARE_PROVIDER_SITE_OTHER): Payer: Medicare Other | Admitting: Internal Medicine

## 2013-09-07 ENCOUNTER — Encounter: Payer: Self-pay | Admitting: Internal Medicine

## 2013-09-07 VITALS — BP 110/60 | HR 80 | Temp 98.0°F | Wt 156.0 lb

## 2013-09-07 DIAGNOSIS — F39 Unspecified mood [affective] disorder: Secondary | ICD-10-CM | POA: Insufficient documentation

## 2013-09-07 NOTE — Assessment & Plan Note (Addendum)
Mostly reactive to his limitations Not MDD Discussed trial of medication--he wasn't excited about this and I am not sure it is a good idea Some of this may be related to the [facility] for his prostate cancer also-- I wonder if a med holiday from this might give him some improvement. I asked him to discuss this with Dr Eliane Decree  He has done well with the social interaction of the PT sessions He is stuck inside though for other times Considering assisted living

## 2013-09-07 NOTE — Progress Notes (Signed)
   Subjective:    Patient ID: Christopher Jacobson, male    DOB: [birth year], 80 y.o.   MRN: [id number]  HPI Here with wife No recent falls Still getting PT--going "very good" It is a good work out and he enjoys the social experience Does feel like his strength is better  He still doesn't "feel good" No energy--feels it is "a noticeable amount of energy" just to get up Also little strength  Current Outpatient Prescriptions on File Prior to Visit  Medication Sig Dispense Refill  . enzalutamide (XTANDI) 40 MG capsule Take 120 mg by mouth daily.      . Probiotic Product (PROBIOTIC DAILY PO) Take by mouth daily.       No current facility-administered medications on file prior to visit.    Allergies  Allergen Reactions  . Codeine     Past Medical History  Diagnosis Date  . Cancer   . Blood transfusion without reported diagnosis   . Urinary incontinence   . Urinary tract infection   . Chicken pox   . Prostate cancer, primary, with metastasis from prostate to other site   . History of urostomy   . Mitral regurgitation   . Squamous cell skin cancer, multiple sites   . Basal cell carcinoma   . Lyme disease 2012    Past Surgical History  Procedure Laterality Date  . Tonsillectomy and adenoidectomy  1933  . Blepharoplasty Bilateral 1980  . Inguinal hernia repair Bilateral 1970  . Cystectomy w/ ureteroileal conduit N/A 11/13    [facility]    Family History  Problem Relation Age of Onset  . Alcohol abuse Father   . Heart disease Mother   . Diabetes Neg Hx   . Hypertension Neg Hx     History   Social History  . Marital Status: Married    Spouse Name: N/A    Number of Children: 4  . Years of Education: N/A   Occupational History  . Retired -- Art therapist of Social work     [employer]   Social History Main Topics  . Smoking status: Former Smoker    Quit date: 01/23/1968  . Smokeless tobacco: Never Used  . Alcohol Use: No  . Drug Use: No  . Sexual Activity: Not on file    Other Topics Concern  . Not on file   Social History Narrative   Has living will   Son Legrand Como has health care POA   Has DNR order already   No tube feeds if cognitively unaware   Review of Systems No pain issues Sleeps very well--gets up first and fixes breakfast. Then will go and lie down again. Does have some sadness but no significant depression--but clear cut frustration from limitations in his ability to do things. Some degree of anhedonia Appetite is pretty good--but has limited taste Son has to help him with a shower    Objective:   Physical Exam  Constitutional: He appears well-developed and well-nourished. No distress.  Psychiatric:  Appearance is normal Speech is goal oriented and appropriate.  No suicidal thoughts--- but "I don't fear death" No hallucinations or delusions          Assessment & Plan:

## 2013-09-07 NOTE — Progress Notes (Signed)
Pre visit review using our clinic review tool, if applicable. No additional management support is needed unless otherwise documented below in the visit note. 

## 2013-11-22 HISTORY — PX: HIP FRACTURE SURGERY: SHX118

## 2013-12-14 ENCOUNTER — Inpatient Hospital Stay: Payer: Self-pay | Admitting: Internal Medicine

## 2013-12-14 ENCOUNTER — Telehealth: Payer: Self-pay | Admitting: *Deleted

## 2013-12-14 LAB — BASIC METABOLIC PANEL
Anion Gap: 7 (ref 7–16)
BUN: 21 mg/dL — AB (ref 7–18)
Calcium, Total: 7.3 mg/dL — ABNORMAL LOW (ref 8.5–10.1)
Chloride: 113 mmol/L — ABNORMAL HIGH (ref 98–107)
Co2: 22 mmol/L (ref 21–32)
Creatinine: 0.9 mg/dL (ref 0.60–1.30)
EGFR (African American): >60
EGFR (Non-African Amer.): >60
Glucose: 83 mg/dL (ref 65–99)
OSMOLALITY: 285 (ref 275–301)
POTASSIUM: 3.8 mmol/L (ref 3.5–5.1)
SODIUM: 142 mmol/L (ref 136–145)

## 2013-12-14 LAB — CBC
HCT: 40.2 % (ref 40.0–52.0)
HGB: 13.1 g/dL (ref 13.0–18.0)
MCH: 33 pg (ref 26.0–34.0)
MCHC: 32.6 g/dL (ref 32.0–36.0)
MCV: 101 fL — AB (ref 80–100)
Platelet: 215 10*3/uL (ref 150–440)
RBC: 3.98 10*6/uL — ABNORMAL LOW (ref 4.40–5.90)
RDW: 15.8 % — ABNORMAL HIGH (ref 11.5–14.5)
WBC: 10.6 10*3/uL (ref 3.8–10.6)

## 2013-12-14 NOTE — Telephone Encounter (Signed)
I will check on him tomorrow

## 2013-12-14 NOTE — Telephone Encounter (Signed)
[facility] calling to let you to pt fell and he was sent out to [place]

## 2013-12-15 LAB — CBC WITH DIFFERENTIAL/PLATELET
BASOS ABS: 0 10*3/uL (ref 0.0–0.1)
BASOS PCT: 0.5 %
EOS ABS: 0.1 10*3/uL (ref 0.0–0.7)
Eosinophil %: 1.9 %
HCT: 35.3 % — ABNORMAL LOW (ref 40.0–52.0)
HGB: 11.9 g/dL — ABNORMAL LOW (ref 13.0–18.0)
LYMPHS PCT: 19.2 %
Lymphocyte #: 1.5 10*3/uL (ref 1.0–3.6)
MCH: 33.5 pg (ref 26.0–34.0)
MCHC: 33.7 g/dL (ref 32.0–36.0)
MCV: 99 fL (ref 80–100)
Monocyte #: 1 x10 3/mm (ref 0.2–1.0)
Monocyte %: 12.9 %
NEUTROS ABS: 5.1 10*3/uL (ref 1.4–6.5)
Neutrophil %: 65.5 %
Platelet: 194 10*3/uL (ref 150–440)
RBC: 3.56 10*6/uL — ABNORMAL LOW (ref 4.40–5.90)
RDW: 15.4 % — ABNORMAL HIGH (ref 11.5–14.5)
WBC: 7.7 10*3/uL (ref 3.8–10.6)

## 2013-12-15 LAB — BASIC METABOLIC PANEL
Anion Gap: 7 (ref 7–16)
BUN: 19 mg/dL — AB (ref 7–18)
CALCIUM: 8.2 mg/dL — AB (ref 8.5–10.1)
Chloride: 107 mmol/L (ref 98–107)
Co2: 25 mmol/L (ref 21–32)
Creatinine: 1.03 mg/dL (ref 0.60–1.30)
EGFR (African American): >60
GLUCOSE: 107 mg/dL — AB (ref 65–99)
Osmolality: 280 (ref 275–301)
Potassium: 4.3 mmol/L (ref 3.5–5.1)
SODIUM: 139 mmol/L (ref 136–145)

## 2013-12-15 LAB — MAGNESIUM: MAGNESIUM: 1.6 mg/dL — AB

## 2013-12-15 NOTE — Telephone Encounter (Signed)
Spoke to him in 151 this AM Fell and broke hip  Surgery pending Will tentatively plan to see him on Monday morning in rehab at [facility]

## 2013-12-16 LAB — BASIC METABOLIC PANEL
ANION GAP: 7 (ref 7–16)
BUN: 17 mg/dL (ref 7–18)
Calcium, Total: 8.2 mg/dL — ABNORMAL LOW (ref 8.5–10.1)
Chloride: 107 mmol/L (ref 98–107)
Co2: 26 mmol/L (ref 21–32)
Creatinine: 1.09 mg/dL (ref 0.60–1.30)
EGFR (African American): >60
Glucose: 107 mg/dL — ABNORMAL HIGH (ref 65–99)
OSMOLALITY: 281 (ref 275–301)
Potassium: 4.7 mmol/L (ref 3.5–5.1)
SODIUM: 140 mmol/L (ref 136–145)

## 2013-12-16 LAB — PLATELET COUNT: Platelet: 184 10*3/uL (ref 150–440)

## 2013-12-16 LAB — HEMOGLOBIN: HGB: 11.7 g/dL — ABNORMAL LOW (ref 13.0–18.0)

## 2013-12-16 LAB — MAGNESIUM: Magnesium: 1.5 mg/dL — ABNORMAL LOW

## 2013-12-17 LAB — HEMOGLOBIN: HGB: 10.5 g/dL — AB (ref 13.0–18.0)

## 2013-12-17 LAB — MAGNESIUM: MAGNESIUM: 2.1 mg/dL

## 2013-12-21 ENCOUNTER — Telehealth: Payer: Self-pay | Admitting: *Deleted

## 2013-12-21 NOTE — Telephone Encounter (Signed)
Pt fell and can't come in for labs, per the office note from [facility] pt needs to repeat his PSA, Met C and CBC dx: prostate cancer. Can these be done at [facility] or just wait?

## 2013-12-21 NOTE — Telephone Encounter (Signed)
Ok, done 

## 2013-12-21 NOTE — Telephone Encounter (Signed)
Cancel them here and I will handle it at [facility]

## 2013-12-22 DIAGNOSIS — R410 Disorientation, unspecified: Secondary | ICD-10-CM

## 2013-12-22 DIAGNOSIS — S72002A Fracture of unspecified part of neck of left femur, initial encounter for closed fracture: Secondary | ICD-10-CM

## 2013-12-22 DIAGNOSIS — C61 Malignant neoplasm of prostate: Secondary | ICD-10-CM

## 2013-12-22 DIAGNOSIS — F39 Unspecified mood [affective] disorder: Secondary | ICD-10-CM

## 2013-12-28 DIAGNOSIS — L89622 Pressure ulcer of left heel, stage 2: Secondary | ICD-10-CM

## 2014-01-05 ENCOUNTER — Ambulatory Visit: Payer: Medicare Other | Admitting: Internal Medicine

## 2014-01-11 DIAGNOSIS — H01023 Squamous blepharitis right eye, unspecified eyelid: Secondary | ICD-10-CM

## 2014-01-11 DIAGNOSIS — H01026 Squamous blepharitis left eye, unspecified eyelid: Secondary | ICD-10-CM

## 2014-01-11 DIAGNOSIS — F321 Major depressive disorder, single episode, moderate: Secondary | ICD-10-CM

## 2014-02-01 ENCOUNTER — Telehealth: Payer: Self-pay | Admitting: Family Medicine

## 2014-02-01 NOTE — Telephone Encounter (Signed)
Pt's son, Ronalee Belts,  left vm on triage phone.  He states that Dr. Silvio Pate took pt off of his prostate cancer medication while following pt at [facility] and he is requesting to have PSA drawn and have the results in Dr. Alla German hands to discuss at f/u visit on 02/08/14.  He wants to make sure that taking pt off of the medication is not having a negative effect.  He asks that no one call his father, he is requesting cb at [phone] or [phone] and gives permission to leave a message if he is unable to answer.

## 2014-02-01 NOTE — Telephone Encounter (Signed)
Okay to set up PSA before his upcoming appt (dx is prostate cancer)

## 2014-02-02 NOTE — Telephone Encounter (Signed)
Left VM for son Ronalee Belts with instructions to call here for lab appt before his fathers follow-up appt

## 2014-02-04 ENCOUNTER — Other Ambulatory Visit (INDEPENDENT_AMBULATORY_CARE_PROVIDER_SITE_OTHER): Payer: Medicare Other

## 2014-02-04 ENCOUNTER — Ambulatory Visit: Payer: Medicare Other | Admitting: Internal Medicine

## 2014-02-04 ENCOUNTER — Encounter: Payer: Self-pay | Admitting: *Deleted

## 2014-02-04 DIAGNOSIS — C61 Malignant neoplasm of prostate: Secondary | ICD-10-CM

## 2014-02-04 LAB — PSA, MEDICARE: PSA: 0.53 ng/mL (ref 0.10–4.00)

## 2014-02-08 ENCOUNTER — Ambulatory Visit (INDEPENDENT_AMBULATORY_CARE_PROVIDER_SITE_OTHER): Payer: Medicare Other | Admitting: Internal Medicine

## 2014-02-08 ENCOUNTER — Encounter: Payer: Self-pay | Admitting: Internal Medicine

## 2014-02-08 VITALS — BP 134/84 | HR 79 | Temp 98.3°F | Ht 69.0 in | Wt 148.8 lb

## 2014-02-08 DIAGNOSIS — F321 Major depressive disorder, single episode, moderate: Secondary | ICD-10-CM

## 2014-02-08 DIAGNOSIS — S72002A Fracture of unspecified part of neck of left femur, initial encounter for closed fracture: Secondary | ICD-10-CM | POA: Insufficient documentation

## 2014-02-08 DIAGNOSIS — S72002D Fracture of unspecified part of neck of left femur, subsequent encounter for closed fracture with routine healing: Secondary | ICD-10-CM

## 2014-02-08 DIAGNOSIS — C61 Malignant neoplasm of prostate: Secondary | ICD-10-CM

## 2014-02-08 DIAGNOSIS — R531 Weakness: Secondary | ICD-10-CM

## 2014-02-08 NOTE — Assessment & Plan Note (Signed)
Has responded well to the sertraline We will continue the sertraline for another 6 months--and then reconsider

## 2014-02-08 NOTE — Assessment & Plan Note (Signed)
Lab Results  Component Value Date   PSA 0.53 02/04/2014   PSA 0.18 12/24/2012   PSA fairly stable off the xtandi Will keep off for now Would probably reconsider this if goes up considerably at next check (?3 months)

## 2014-02-08 NOTE — Assessment & Plan Note (Signed)
Doing well Continues with therapy No pain issues

## 2014-02-08 NOTE — Assessment & Plan Note (Signed)
Son especially worried that this may be related to [place] Will keep off for now

## 2014-02-08 NOTE — Progress Notes (Signed)
   Subjective:    Patient ID: Christopher Jacobson, male    DOB: 1924/07/30, 81 y.o.   MRN: [id number]  HPI Here with son for follow up on left hip fracture Had it repaired -- then to [facility] for rehab  Home for 10-12 days Doing well at home Increasing his activities Walking fairly well with the rolling walker Not having any pain  Was started on sertraline at [facility] by me Had persistent depressed mood that was affecting his rehab Has responded well to the sertraline  Stopped the prostate cancer med because it was too big to take PSA is now up slightly--- was 0.2-0.4, but now 0.5 Will stay off the [facility] for now  No current outpatient prescriptions on file prior to visit.   No current facility-administered medications on file prior to visit.    Allergies  Allergen Reactions  . Codeine     Past Medical History  Diagnosis Date  . Cancer   . Blood transfusion without reported diagnosis   . Urinary incontinence   . Urinary tract infection   . Chicken pox   . Prostate cancer, primary, with metastasis from prostate to other site   . History of urostomy   . Mitral regurgitation   . Squamous cell skin cancer, multiple sites   . Basal cell carcinoma   . Lyme disease 2012    Past Surgical History  Procedure Laterality Date  . Tonsillectomy and adenoidectomy  1933  . Blepharoplasty Bilateral 1980  . Inguinal hernia repair Bilateral 1970  . Cystectomy w/ ureteroileal conduit N/A 11/13    [facility]  . Hip fracture surgery Left 11/15    Family History  Problem Relation Age of Onset  . Alcohol abuse Father   . Heart disease Mother   . Diabetes Neg Hx   . Hypertension Neg Hx     History   Social History  . Marital Status: Married    Spouse Name: N/A    Number of Children: 4  . Years of Education: N/A   Occupational History  . Retired -- Art therapist of Social work     [employer]   Social History Main Topics  . Smoking status: Former Smoker    Quit date: 01/23/1968    . Smokeless tobacco: Never Used  . Alcohol Use: No  . Drug Use: No  . Sexual Activity: Not on file   Other Topics Concern  . Not on file   Social History Narrative   Has living will   Son Christopher Jacobson has health care POA   Has DNR order already   No tube feeds if cognitively unaware   Review of Systems Eating okay  Sleeps well Weight is down 8# since his last visit here    Objective:   Physical Exam  Constitutional: He appears well-developed. No distress.  Cardiovascular: Normal rate and regular rhythm.  Exam reveals no gallop.   Gr 3/6 systolic murmur loudest at apex  Pulmonary/Chest: Effort normal and breath sounds normal. No respiratory distress. He has no wheezes. He has no rales.  Musculoskeletal: He exhibits no edema or tenderness.  Neurological:  Able to get up and down and walk with walker independently  Psychiatric: He has a normal mood and affect. His behavior is normal.          Assessment & Plan:

## 2014-03-17 ENCOUNTER — Emergency Department: Payer: Self-pay | Admitting: Emergency Medicine

## 2014-05-13 ENCOUNTER — Encounter: Payer: Self-pay | Admitting: Internal Medicine

## 2014-05-13 ENCOUNTER — Ambulatory Visit (INDEPENDENT_AMBULATORY_CARE_PROVIDER_SITE_OTHER): Payer: Medicare Other | Admitting: Internal Medicine

## 2014-05-13 VITALS — BP 110/68 | HR 83 | Temp 98.0°F | Wt 152.0 lb

## 2014-05-13 DIAGNOSIS — C61 Malignant neoplasm of prostate: Secondary | ICD-10-CM | POA: Diagnosis not present

## 2014-05-13 DIAGNOSIS — Z9889 Other specified postprocedural states: Secondary | ICD-10-CM

## 2014-05-13 DIAGNOSIS — S72002E Fracture of unspecified part of neck of left femur, subsequent encounter for open fracture type I or II with routine healing: Secondary | ICD-10-CM | POA: Diagnosis not present

## 2014-05-13 DIAGNOSIS — F321 Major depressive disorder, single episode, moderate: Secondary | ICD-10-CM | POA: Diagnosis not present

## 2014-05-13 NOTE — Progress Notes (Signed)
   Subjective:    Patient ID: Christopher Jacobson, male    DOB: 1924-07-25, 81 y.o.   MRN: [id number]  HPI    Review of Systems     Objective:   Physical Exam  Cardiovascular:  Gr 3/6 blowing systolic murmur (mitral)          Assessment & Plan:

## 2014-05-13 NOTE — Assessment & Plan Note (Signed)
He stopped the antidepressant on his own (after about 3 months Rx) Seems to be doing fine Will stay off and observe for any signs of recurrence

## 2014-05-13 NOTE — Assessment & Plan Note (Signed)
No problems with his urostomy He is independent with his own care of this

## 2014-05-13 NOTE — Progress Notes (Signed)
   Subjective:    Patient ID: Christopher Jacobson, male    DOB: 04-09-1924, 81 y.o.   MRN: [id number]  HPI Here with son  Seems better with the hip Walking fairly well with rollator Went for follow up with surgeon and was discharged  Has gone back to Dr Mina Marble at [facility] Has appt next week PSA was not done last time  Appetite is "decent"  He stopped the sertraline several weeks ago Mood has been fine since No depression  No current outpatient prescriptions on file prior to visit.   No current facility-administered medications on file prior to visit.    Allergies  Allergen Reactions  . Codeine     Past Medical History  Diagnosis Date  . Cancer   . Blood transfusion without reported diagnosis   . Urinary incontinence   . Urinary tract infection   . Chicken pox   . Prostate cancer, primary, with metastasis from prostate to other site   . History of urostomy   . Mitral regurgitation   . Squamous cell skin cancer, multiple sites   . Basal cell carcinoma   . Lyme disease 2012    Past Surgical History  Procedure Laterality Date  . Tonsillectomy and adenoidectomy  1933  . Blepharoplasty Bilateral 1980  . Inguinal hernia repair Bilateral 1970  . Cystectomy w/ ureteroileal conduit N/A 11/13    [facility]  . Hip fracture surgery Left 11/15    Family History  Problem Relation Age of Onset  . Alcohol abuse Father   . Heart disease Mother   . Diabetes Neg Hx   . Hypertension Neg Hx     History   Social History  . Marital Status: Married    Spouse Name: N/A  . Number of Children: 4  . Years of Education: N/A   Occupational History  . Retired -- Art therapist of Social work     [employer]   Social History Main Topics  . Smoking status: Former Smoker    Quit date: 01/23/1968  . Smokeless tobacco: Never Used  . Alcohol Use: No  . Drug Use: No  . Sexual Activity: Not on file   Other Topics Concern  . Not on file   Social History Narrative   Has living will   Son Legrand Como  has health care POA   Has DNR order already   No tube feeds if cognitively unaware   Review of Systems Weight stable Sleeps fine--- longer at night now. Up to 10 hours No longer getting aide help at home--son helps him shower    Objective:   Physical Exam  Constitutional: He appears well-developed. No distress.  Neck: Normal range of motion. Neck supple. No thyromegaly present.  Cardiovascular: Normal rate, regular rhythm and normal heart sounds.  Exam reveals no gallop.   No murmur heard. Pulmonary/Chest: Effort normal and breath sounds normal. No respiratory distress. He has no wheezes. He has no rales.  Musculoskeletal: He exhibits no edema or tenderness.  Lymphadenopathy:    He has no cervical adenopathy.  Psychiatric: He has a normal mood and affect. His behavior is normal.          Assessment & Plan:

## 2014-05-13 NOTE — Assessment & Plan Note (Signed)
Will check PSA today and send to Dr Shona Needles appt next week

## 2014-05-13 NOTE — Assessment & Plan Note (Signed)
Has healed well No longer needing aide help at home Using rollator

## 2014-05-13 NOTE — Progress Notes (Signed)
Pre visit review using our clinic review tool, if applicable. No additional management support is needed unless otherwise documented below in the visit note. 

## 2014-05-14 ENCOUNTER — Encounter: Payer: Self-pay | Admitting: *Deleted

## 2014-05-14 LAB — PSA: PSA: 1.19 ng/mL (ref 0.10–4.00)

## 2014-05-14 NOTE — Discharge Summary (Signed)
PATIENT NAME:  Christopher Jacobson, GLADDEN MR#:  [id number] DATE OF BIRTH:  [birth year]  DATE OF ADMISSION:  07/03/2012 DATE OF DISCHARGE:  07/05/2012  ADMISSION DIAGNOSIS: Systemic inflammatory response syndrome (SIRS).   DISCHARGE DIAGNOSES: 1.  Systemic inflammatory response syndrome (SIRS), secondary to acute enterococcus pyelonephritis.  2.  History of prostate cancer. 3.  Generalized weakness, secondary to infection.   CONSULTS: None.   LABORATORIES AT DISCHARGE: White blood cells 8.8, hemoglobin 10.5, hematocrit  30, platelets are 198. Sodium 142, potassium 2.9, chloride 111, bicarbonate 24, BUN 20, creatinine 1.03, glucose 109.   Blood cultures negative to-date.  Urine culture grew out greater than 100,000 GPCs. I did call the lab and they said this was enterococcus.   HOSPITAL COURSE: An 81 year old male with a history of prostate cancer who presented with generalized weakness. For further details, please refer to the H and P.   1.  SIRS: The patient had leukocytosis on admission, was tachycardic, also a low-grade fever. His urine culture is growing out GPCs. I spoke with the lab. They thought this was enterococcus, and per the sensitivities from the nomogram this could be sensitive to ampicillin as well as Levaquin and ciprofloxacin. The patient was actually on Rocephin and did quite well here with Rocephin, even though enterococcus is not sensitive to the Rocephin. He was afebrile. He will be discharged with ciprofloxacin.  2.  History of prostate cancer: He follows at [facility]. He is on an antiandrogen called Xtandi. 3. Generalized weakness: Thought to be secondary to infection, or could be some mild dehydration as the patient tolerated physical therapy well and is doing well.   DISCHARGE MEDICATIONS: 1.  Ciprofloxacin 500 mg b.i.d. for 10 days.  2.  Enzalutamide 120 mg daily.   DISCHARGE DIET: Regular diet, with Ensure b.i.d.   DISCHARGE ACTIVITY: As tolerated.   DISCHARGE FOLLOWUP: The  patient will follow up with his [facility] urologist.   TIME SPENT: Approximately 35 minutes.   The patient is medically stable for discharge.     ____________________________ Mohmed Farver P. Benjie Karvonen, MD spm:dm D: 07/05/2012 14:08:00 ET T: 07/06/2012 11:21:09 ET JOB#: [id number]  cc: Nataliah Hatlestad P. Benjie Karvonen, MD, <Dictator> Donell Beers Tudor Chandley MD ELECTRONICALLY SIGNED 07/07/2012 14:20

## 2014-05-14 NOTE — H&P (Signed)
PATIENT NAME:  Christopher Jacobson, Christopher Jacobson MR#:  [id number] DATE OF BIRTH:  [birth year]  DATE OF ADMISSION:  07/03/2012  ADMITTING PHYSICIAN:  Gladstone Lighter, MD   PRIMARY CARE PHYSICIAN: Johny Blamer at [facility], M.D.  PRIMARY UROLOGIST:  [facility]   Primary Oncologist: Dr. Eliane Decree at [place]: Generalized weakness.   HISTORY OF PRESENT ILLNESS: Mr. 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 The patient usually is very active at baseline, drives, and walks around the house.  He was recently started on antiandrogen medication to call Xtandi by his oncologist and he has been feeling a little weak secondary to so the dose was also reduced. He actually drove to [facility], visited a friend, woke up this morning could not even get up, extremely weak, had a low-grade fever at home, so was brought into the hospital. The patient denies any chest pain, abdominal pain, nausea or vomiting. According to the son, who gives most of the history, the patient has had a few UTIs lately since the urostomy was placed and each time he gets a urinary tract infection, he gets extremely sick.    PAST MEDICAL HISTORY: 1.  Prostate cancer.  2.  Mitral valve prolapse.   PAST SURGICAL HISTORY:   1.  Prostatectomy done, cystectomy done and urostomy placed.  2.  Orchiectomy.  3.  Double blepharoplasty.  4.  Double inguinal hernia repairs.   ALLERGIES TO MEDICATIONS: CODEINE.  HOME MEDICATIONS:  Xtandi 120 mg p.o. daily.   SOCIAL HISTORY: Lives at [facility] independent living with the wife. Smoked more than 30 years ago. He quit smoking since then and occasional alcohol use.   FAMILY HISTORY: Father died in an accident in his 55s and mom  lived up to 65s and had heart disease.   REVIEW OF SYSTEMS:  CONSTITUTIONAL: Positive for low-grade fever, fatigue and weakness.  EYES: No blurred vision, double vision, inflammation or glaucoma.  ENT: No tinnitus, ear pain. Positive for hearing loss and uses hearing aids. No epistaxis or discharge.  RESPIRATORY: No cough, wheeze, hemoptysis or chronic obstructive pulmonary disease.  CARDIOVASCULAR: No chest pain, orthopnea, edema, arrhythmia, palpitations or syncope.  GASTROINTESTINAL: No nausea, vomiting, abdominal pain, hematemesis or melena.  GENITOURINARY: No dysuria, hematuria, renal calculus, frequency or incontinence  ENDOCRINE: No polyuria, nocturia, thyroid problems, heat or cold intolerance.  HEMATOLOGY: No anemia, easy bruising or bleeding.  SKIN: No acne, rash or lesions. Has recent surgical site on the chest with sutures in place for removal of skin cancer.  MUSCULOSKELETAL: No neck, back, shoulder pain, arthritis or gout.  NEUROLOGIC: No CVA, transient ischemic attack, seizures. PSYCHOLOGICAL: No anxiety, insomnia, depression.   PHYSICAL EXAMINATION: VITAL SIGNS: Temperature 98.3 degrees Fahrenheit, pulse 92, respirations 20, blood pressure 104/60 and pulse oximetry 95% on room air.  GENERAL: Well-built, well-nourished male lying in bed, appears extremely weak and fatigued.  HEENT: Normocephalic, atraumatic. Pupils equal, round, reacting to light. Anicteric sclerae. Extraocular movements intact. Nose is clear without any lesions or discharge. Ears appear to be clear with no drainage or lesions. Oropharynx clear without erythema, mass or exudates. Extremely dry mucous membranes.  NECK: Supple. No thyromegaly, JVD or carotid bruits. No lymphadenopathy.  LUNGS: Moving air bilaterally. No wheeze or  crackles. No use of accessory muscles for breathing. Decreased bibasilar breath sounds.   CARDIOVASCULAR: S1, S2 regular rate and rhythm, 3/6 diastolic murmur in the mitral area.   ABDOMEN: Soft, nontender, nondistended. No hepatosplenomegaly. Normal bowel sounds.  EXTREMITIES: No pedal edema. No clubbing or cyanosis, 2+ dorsalis pedis pulses palpable bilaterally.  MUSCULOSKELETAL: No tenderness or effusion of any joints.  SKIN:  No acne, rash or lesions, other than sutures seen on the chest wall.   NEUROLOGIC: Cranial nerves are intact. Motor strength is 4/5 in all 4 extremities. More generalized weakness is elicited rather than focal weakness. Sensation is intact. Because of extreme weakness, unable to test his gait. Straight leg raising tested and negative bilaterally.   PSYCHOLOGICAL: The patient is awake, alert, oriented x 3.   LABORATORY, DIAGNOSTIC AND RADIOLOGICAL DATA: Urinalysis with 2 blood and trace leukocyte esterase, 25 WBCs, 1+ bacteria seen.    WBC 14.4, hemoglobin 11.4, hematocrit 33.7 , platelet count 220.  Sodium 138, potassium 3.9, chloride 105, bicarbonate 24, BUN 25, creatinine 1.1, glucose 114, calcium 9.0. ALT 16, AST 22, alkaline phosphatase 654, with O2 bilirubin 0.6 and albumin of 3.0. Troponin is negative.   EKG showing junctional rhythm, with a heart rate of 90 and heart rate on the monitor shooting up 100 on exam.   ASSESSMENT AND PLAN:  1.  This is an 81 year old elderly male with history of prostate cancer and recent urostomy placed after cystectomy  from radiation cystitis secondary generalized weakness. Systemic inflammatory response syndrome secondary to acute pyelonephritis, has leukocyte was tachycardic and also low-grade fever. Urine and blood cultures are sent. He started on Rocephin, IV fluids, continue to monitor.  2.  History of prostate cancer. He follows at [facility]. He is on antiandrogen called Xtandi, can continue that.  3.  Generalized weakness secondary to his infection likely. Monitor and physical therapy consulted.   4.  Gastrointestinal and deep vein thrombosis prophylaxis on Protonix and TED stockings.   CODE STATUS: DO NOT  RESUSCITATE. He has an out of facility DO NOT RESUSCITATE form signed by his PCP Dr. Joella Prince in the chart.   TIME SPENT ON ADMISSION: 50 minutes.    ____________________________ Gladstone Lighter, MD rk:cc D: 07/03/2012 38:33:38 ET T: 07/03/2012 20:00:13 ET JOB#: [id number]  cc: Gladstone Lighter, MD, <Dictator> Dr. Johny Blamer at [place] MD ELECTRONICALLY SIGNED 07/15/2012 15:07

## 2014-05-14 NOTE — Progress Notes (Signed)
   Subjective:    Patient ID: Christopher Jacobson, male    DOB: March 24, 1924, 81 y.o.   MRN: [id number]  HPI He remains independent caring for his ostomy No problems with site Converts to larger bag at night so able to sleep uninterrupted   Review of Systems     Objective:   Physical Exam        Assessment & Plan:

## 2014-05-15 NOTE — Consult Note (Signed)
Brief Consult Note: Diagnosis: left displaced femoral neck fracture.   Patient was seen by consultant.   Recommend to proceed with surgery or procedure.   Orders entered.   Comments: left tha recommended.  Electronic Signatures: Laurene Footman (MD)  (Signed [phone] 17:27)  Authored: Brief Consult Note   Last Updated: 23-Nov-15 17:27 by Laurene Footman (MD)

## 2014-05-15 NOTE — Op Note (Signed)
PATIENT NAME:  Christopher Jacobson, SKORA MR#:  [id number] DATE OF BIRTH:  1924-04-20  DATE OF PROCEDURE:  12/15/2013  PREOPERATIVE DIAGNOSIS:  Left femoral neck fracture, acute, with mild degenerative arthritis.   POSTOPERATIVE DIAGNOSIS:  Left femoral neck fracture, acute, with mild degenerative arthritis.   PROCEDURE:  Left anterior total hip replacement.   ANESTHESIA:  Spinal.   SURGEON:  Laurene Footman, MD   DESCRIPTION OF PROCEDURE:  The patient was brought to the operating room, and after adequate anesthesia was obtained, the patient was placed on the operative table with the right leg on a well-padded table and left foot in the Medacta attachment. The hip was prepped and draped in the usual sterile fashion. After patient identification and timeout procedures were completed and preoperative template x-ray was taken, direct anterior approach was made centered over the TFL muscle and the greater trochanter. An oblique incision was made, and the TFL muscle was incised and retracted laterally. Deep retractor was placed and the lateral femoral circumflex vessels ligated. The anterior capsule was then exposed and a capsulotomy created. The fracture went through essentially the appropriate level for femoral neck cut, and the head was removed without much difficulty. The head had very soft bone consistent with severe osteopenia. The labrum was excised. The acetabulum had moderate degenerative change. Reaming was carried out to 56 mm, at which point there was good bleeding bone and a 56 mm trial fit well and was impacted into place on the acetabular side. Next, external rotation was carried out. The hip was quite stiff. Pubofemoral and ischiofemoral releases required. The leg was dropped into extension and sequential broaching carried out up to a size 4. The 4 standard stem was subsequently impacted after trials had been place with an S head and the liner for the 56 mm cup. When all components were assembled and  the hip was reduced, leg lengths appeared equal and soft tissue balance also seemed appropriate. The wound was thoroughly irrigated. Then, 30 mL of 0.25% Sensorcaine with epinephrine was infiltrated in the subcutaneous tissue for postoperative analgesia. The deep fascia was repaired using a heavy quill suture, 2-0 quill subcutaneously, and skin staples. Xeroform, 4 x 4's, abdomen, and tape were applied.   ESTIMATED BLOOD LOSS:  200.   COMPLICATIONS:  None.   SPECIMEN:  Removed femoral head.   IMPLANTS:  Medacta #4 Amis stem with a 56 mm Versafitcup DM with liner and an S 28 mm head.    ____________________________ Laurene Footman, MD mjm:nb D: 12/15/2013 21:44:16 ET T: 12/15/2013 22:15:45 ET JOB#: [id number]  cc: Laurene Footman, MD, <Dictator> Laurene Footman MD ELECTRONICALLY SIGNED 12/16/2013 0:40

## 2014-05-15 NOTE — H&P (Signed)
PATIENT NAME:  Christopher Jacobson, Christopher Jacobson MR#:  [id number] DATE OF BIRTH:  11/03/1924  DATE OF ADMISSION:  12/14/2013  PRIMARY CARE PHYSICIAN:  Nonlocal.   REFERRING PHYSICIAN:  Shellia Cleverly, MD   CHIEF COMPLAINT: Fall and left hip pain today.   HISTORY OF PRESENT ILLNESS: An 81 year old Caucasian male with a history of prostate cancer status post urostomy presented to the ED with the above chief complaint. The patient is alert, awake, oriented, in no acute distress. According to the patient and patient's wife, patient fell, by accident at home and hit head, neck and left hip today. He is unable to move left lower extremity, but the patient denies any headache, dizziness, loss of consciousness or syncope. The patient denies any incontinence.   The patient's left hip x-ray showed left femur neck fracture. Dr. Rudene Christians saw the patient, will get a left hip surgery tomorrow.   PAST MEDICAL HISTORY: Prostate cancer, mitral wall prolapse.   PAST SURGICAL HISTORY: Urostomy, prostatectomy, orchiectomy, inguinal hernia repair.   SOCIAL HISTORY: No smoking or drinking or illicit drugs, living in [place] with his wife.   FAMILY HISTORY: Father died of an accident in his 53s, and mother lived up to 45, and had heart disease.   ALLERGIES: TO CODEINE.   HOME MEDICATIONS:  Xtandi 40 mg p.o. 3 caps a day.   REVIEW OF SYSTEMS:   CONSTITUTIONAL: The patient denies any fever or chills, no headache, no dizziness, no weakness.  EYES: No double vision or blurry vision.  ENT: No postnasal drip, slurred speech or dysphagia.  CARDIOVASCULAR: No chest pain, palpitation, orthopnea, or nocturnal dyspnea. No leg edema.  PULMONARY: No cough, sputum, shortness of breath, or hematemesis.  GASTROINTESTINAL: No abdominal pain, nausea, vomiting, diarrhea. No melena or bloody stool.  GENITOURINARY: No dysuria, hematuria or incontinence.  SKIN: No rash or jaundice.  NEUROLOGIC: No syncope, loss of  consciousness, or seizure.  ENDOCRINE: No polyuria, polydipsia, heat or cold intolerance.  HEMATOLOGIC: No easy bruising or bleeding.  MUSCULOSKELETAL: Left hip pain.   VITAL SIGNS: Temperature 97.6, blood pressure 121/85, pulse 82, oxygen saturation 97% on oxygen.  GENERAL: The patient is alert, awake, oriented in no acute distress.  HEENT: Pupils round, equal and reactive to light and accommodation, moist oral mucosa, clear oropharynx.  NECK: Supple. No JVD or carotid bruit. No lymphadenopathy. No thyromegaly,  CARDIOVASCULAR: S1 and S2, regular rate and rhythm, there is a heart murmur, no gallop.  PULMONARY: Bilateral air entry. No wheezing or rales. No use of accessory muscle to breathe.  ABDOMEN: Soft. No distention or tenderness. No organomegaly. Bowel sounds present.  EXTREMITIES: No edema, clubbing or cyanosis. No calf tenderness. Bilateral pedal pulses present. The patient is unable to move left lower extremity due to left hip pain.  NEUROLOGY: AO x 3. No focal deficit, power 3 to 4/5. Sensation intact. The patient is unable to move left lower extremity due to hip pain.   DIAGNOSTIC DATA:  1.  Chest x-ray did not show any acute cardiopulmonary abnormality. EKG showed junctional rhythm at 81 BPM with prolonged QT.  2.  WBC 10.6, hemoglobin 13.1, platelets 215,000.  3.  CAT scan of head without contrast, no acute abnormality.  4.  Cervical spine CT degenerative changes without acute bony abnormality left hip, x-ray showed minimally displaced left femoral neck fracture.  5.  Glucose 83, BUN 21, creatinine 0.9, sodium 142, potassium 3.8, chloride 113, bicarbonate 22.   IMPRESSIONS: 1.  Left femoral neck  fracture.  2.  History of prostate cancer status post urostomy.  3.  History of heart murmur with mitral valve prolapse.   PLAN OF TREATMENT: 1.  For hip fracture. the patient has moderate risk for hip surgery. Dr. Rudene Christians will do surgery tomorrow; we will get a PT, PTT, INR, pain  control and DVT prophylaxis after surgery and PT after surgery. We will follow-up hemoglobin after surgery.  2.  I discussed the patient's condition and plan of treatment with the patient and the patient's wife and the daughter-in-law.  3.  The patient's code status is do not resuscitate. According to the patient's daughter-in-law the patient has a yellow paper of DNR and a living will.  But since the patient will have surgery, if patient has a cardiac arrest and respiratory failure during surgery the patient wants resuscitation and intubation, but the patient does not want prolonged time on machine. Patient's code status is do not resuscitate, except during surgery.   TIME SPENT: About 63 minutes.    ____________________________ Demetrios Loll, MD qc:nt D: 12/14/2013 17:54:32 ET T: 12/14/2013 18:49:14 ET JOB#: [id number]  cc: Demetrios Loll, MD, <Dictator> Demetrios Loll MD ELECTRONICALLY SIGNED 12/14/2013 20:59

## 2014-05-15 NOTE — Discharge Summary (Signed)
PATIENT NAME:  Christopher Jacobson, Christopher Jacobson MR#:  [id number] DATE OF BIRTH:  October 27, 1924  DATE OF ADMISSION:  12/14/2013 DATE OF DISCHARGE:  12/18/2013    CONSULTATIONS:  Skip Estimable, MD  DISCHARGE DIAGNOSES:  1.  Left hip fracture status post total hip replacement.  2.  Anemia.   PROCEDURE: Left total hip replacement.   CONDITION: Stable.   CODE STATUS: DO NOT RESUSCITATE.   HOME MEDICATIONS: Please refer to the medication reconciliation list.   DIET: Regular diet.   ACTIVITY: As tolerated.   FOLLOW-UP CARE: Follow with PCP and Dr. Marry Guan within 1 to 2 weeks.   REASON FOR ADMISSION: Fall and left hip pain.   HOSPITAL COURSE: The patient is an 81 year old Caucasian male with a history of prostate cancer status post urostomy who was sent to ED after fall and hip pain. The patient x-ray showed left hip femoral neck surgery, Dr. Marry Guan did a left total replacement.  After surgery, the patient's hemoglobin gradually decreased from 13.1 to 10.5, which is possibly due to IV fluid dilution and some blood loss from surgery. Hemoglobin is stable. The patient has no complaints.  The patient underwent physical therapy. The patient is clinically stable and will be discharged to subacute rehabilitation today. I discussed the patient's discharge plan with the patient, nurse, case Freight forwarder and Education officer, museum.   TIME SPENT: About 36 minutes.     ____________________________ Demetrios Loll, MD qc:DT D: 12/18/2013 08:11:00 ET T: 12/18/2013 08:24:06 ET JOB#: [id number]  cc: Demetrios Loll, MD, <Dictator> Demetrios Loll MD ELECTRONICALLY SIGNED 12/18/2013 15:23

## 2014-05-15 NOTE — Consult Note (Signed)
PATIENT NAME:  Christopher Jacobson, Christopher Jacobson MR#:  [id number] DATE OF BIRTH:  [birth year]  DATE OF CONSULTATION:  12/14/2013  REFERRING PHYSICIAN:   CONSULTING PHYSICIAN:  Laurene Footman, MD  REASON FOR CONSULT:  Left hip pain.   HISTORY OF PRESENT ILLNESS: The patient is an 81 year old who has been ambulating with a walker. He is a resident of [facility], and has been functioning fairly well. He is seen with his daughter-in-law and wife. He denies any prodromal symptoms. He was outside and fell in the grass. Was brought to the Emergency Room where he was found to have a displaced femoral neck fracture. Radiologist reports minimally displaced but shortened and has significant angulation.   PAST MEDICAL HISTORY: Remarkable for metastatic prostate cancer, he had extensive radiation treatment, has cystoscopy from that. Other than this surgery and multiple prostate surgeries, he had been fairly healthy.   Currently is taking oral medication for metastatic prostate cancer.   His treatment there for his prostate has been at [facility], and I will try to contact his doctor there to see if there are prior bone scans, if there were any lesions in the left femur.   PHYSICAL EXAMINATION:  EXTREMITIES:  His left leg is shortened and externally rotated. He has strong dorsalis pedis and posterior tibialis pulses with just trace edema.  NEUROLOGIC: Sensation intact.  Of note is a right-sided cystostomy.   X-rays reveal displaced femoral neck fracture with mild degenerative change.   CLINICAL IMPRESSION: Displaced femoral neck fracture.   RECOMMENDATION: Total hip through an anterior approach. Risks, benefits, and possible complications were discussed, in particular lateral femoral cutaneous nerve injury, bleeding, infection, and DVT. He understands this and will hope for surgery tomorrow if no medical problems arise.    ____________________________ Laurene Footman, MD mjm:nt D: 12/14/2013 17:44:17  ET T: 12/14/2013 19:30:58 ET JOB#: [id number]  cc: Laurene Footman, MD, <Dictator> Laurene Footman MD ELECTRONICALLY SIGNED 12/15/2013 5:55

## 2014-05-17 LAB — SURGICAL PATHOLOGY

## 2014-05-26 ENCOUNTER — Other Ambulatory Visit: Payer: Self-pay | Admitting: Internal Medicine

## 2014-05-26 ENCOUNTER — Telehealth: Payer: Self-pay | Admitting: *Deleted

## 2014-05-26 DIAGNOSIS — C61 Malignant neoplasm of prostate: Secondary | ICD-10-CM

## 2014-05-26 NOTE — Telephone Encounter (Signed)
Spoke with patient and advised results  Lab appt scheduled  

## 2014-05-26 NOTE — Telephone Encounter (Signed)
Call from [facility], they are wanting pt to get labs here before his appt in August, asking for CBC, BMP, & PSA. See forms on your desk

## 2014-05-26 NOTE — Telephone Encounter (Signed)
Okay to schedule him--I put the order in for the tests They can see the results in Care Everywhere

## 2014-08-16 ENCOUNTER — Other Ambulatory Visit (INDEPENDENT_AMBULATORY_CARE_PROVIDER_SITE_OTHER): Payer: Medicare Other

## 2014-08-16 DIAGNOSIS — C61 Malignant neoplasm of prostate: Secondary | ICD-10-CM | POA: Diagnosis not present

## 2014-08-16 LAB — CBC WITH DIFFERENTIAL/PLATELET
BASOS ABS: 0 10*3/uL (ref 0.0–0.1)
BASOS PCT: 0.3 % (ref 0.0–3.0)
EOS ABS: 0.2 10*3/uL (ref 0.0–0.7)
Eosinophils Relative: 2.2 % (ref 0.0–5.0)
HCT: 39.7 % (ref 39.0–52.0)
Hemoglobin: 13.1 g/dL (ref 13.0–17.0)
Lymphocytes Relative: 26.8 % (ref 12.0–46.0)
Lymphs Abs: 2.2 10*3/uL (ref 0.7–4.0)
MCHC: 32.9 g/dL (ref 30.0–36.0)
MCV: 98.5 fl (ref 78.0–100.0)
Monocytes Absolute: 0.9 10*3/uL (ref 0.1–1.0)
Monocytes Relative: 10.7 % (ref 3.0–12.0)
NEUTROS ABS: 4.9 10*3/uL (ref 1.4–7.7)
Neutrophils Relative %: 60 % (ref 43.0–77.0)
Platelets: 244 10*3/uL (ref 150.0–400.0)
RBC: 4.03 Mil/uL — AB (ref 4.22–5.81)
RDW: 15.8 % — ABNORMAL HIGH (ref 11.5–15.5)
WBC: 8.2 10*3/uL (ref 4.0–10.5)

## 2014-08-16 LAB — RENAL FUNCTION PANEL
Albumin: 3.7 g/dL (ref 3.5–5.2)
BUN: 24 mg/dL — ABNORMAL HIGH (ref 6–23)
CO2: 27 mEq/L (ref 19–32)
Calcium: 9.2 mg/dL (ref 8.4–10.5)
Chloride: 105 mEq/L (ref 96–112)
Creatinine, Ser: 1.25 mg/dL (ref 0.40–1.50)
GFR: 57.88 mL/min — ABNORMAL LOW (ref >60.00)
Glucose, Bld: 84 mg/dL (ref 70–99)
POTASSIUM: 4.5 meq/L (ref 3.5–5.1)
Phosphorus: 3.4 mg/dL (ref 2.3–4.6)
Sodium: 139 mEq/L (ref 135–145)

## 2014-08-16 LAB — PSA: PSA: 6.98 ng/mL — ABNORMAL HIGH (ref 0.10–4.00)

## 2014-08-25 ENCOUNTER — Telehealth: Payer: Self-pay | Admitting: Internal Medicine

## 2014-08-25 DIAGNOSIS — C61 Malignant neoplasm of prostate: Secondary | ICD-10-CM

## 2014-08-25 NOTE — Telephone Encounter (Signed)
No answer at home number, phone just rang, no answering machine, will wait for son or pt to return my call. They are aware that labs will be done here like it was before

## 2014-08-25 NOTE — Telephone Encounter (Signed)
Christopher Jacobson from [facility] called from Dr Dolan Amen office  - she is requesting that labs be pulled for Cbc, bmp, psa at the end of November, the week of Nov 21st.  Can you put orders in for this?  Callback [phone].

## 2014-08-25 NOTE — Telephone Encounter (Signed)
Orders have been placed Please set up the appointment for the blood work

## 2014-11-18 ENCOUNTER — Ambulatory Visit (INDEPENDENT_AMBULATORY_CARE_PROVIDER_SITE_OTHER): Payer: Medicare Other | Admitting: Internal Medicine

## 2014-11-18 ENCOUNTER — Encounter: Payer: Self-pay | Admitting: Internal Medicine

## 2014-11-18 VITALS — BP 118/62 | HR 83 | Temp 97.6°F | Wt 157.0 lb

## 2014-11-18 DIAGNOSIS — Z23 Encounter for immunization: Secondary | ICD-10-CM | POA: Diagnosis not present

## 2014-11-18 DIAGNOSIS — C61 Malignant neoplasm of prostate: Secondary | ICD-10-CM

## 2014-11-18 DIAGNOSIS — F321 Major depressive disorder, single episode, moderate: Secondary | ICD-10-CM | POA: Diagnosis not present

## 2014-11-18 DIAGNOSIS — H01002 Unspecified blepharitis right lower eyelid: Secondary | ICD-10-CM

## 2014-11-18 DIAGNOSIS — Z9889 Other specified postprocedural states: Secondary | ICD-10-CM

## 2014-11-18 DIAGNOSIS — M25571 Pain in right ankle and joints of right foot: Secondary | ICD-10-CM | POA: Diagnosis not present

## 2014-11-18 MED ORDER — ARTIFICIAL TEARS OP OINT: TOPICAL_OINTMENT | OPHTHALMIC | Status: DC | PRN

## 2014-11-18 NOTE — Assessment & Plan Note (Signed)
Will recheck PSA before follow up with Dr Mina Marble

## 2014-11-18 NOTE — Assessment & Plan Note (Signed)
Doesn't seem to be bony at all Discussed using padding to reduce sensitivity

## 2014-11-18 NOTE — Addendum Note (Signed)
Addended by: Despina Hidden on: 11/18/2014 03:36 PM   Modules accepted: Orders

## 2014-11-18 NOTE — Assessment & Plan Note (Signed)
Leaks at times but generally doing okay

## 2014-11-18 NOTE — Assessment & Plan Note (Signed)
Better but still has some bad days Off the medicine and mostly seems reactive so won't restart

## 2014-11-18 NOTE — Progress Notes (Signed)
Pre visit review using our clinic review tool, if applicable. No additional management support is needed unless otherwise documented below in the visit note. 

## 2014-11-18 NOTE — Progress Notes (Signed)
Subjective:    Patient ID: Christopher Jacobson, male    DOB: 08-26-1924, 81 y.o.   MRN: [id number]  HPI Here with son Doing okay  Still on watchful waiting for the prostate cancer Decided not to restart the meds Will be going back on 12/1 to see Dr Cherylann Ratel need PSA done before that No bone pain from this  Hip is pretty much better Having trouble now with right ankle Worries about "something broken" --right by lateral malleolus Sleeps on that side--has chronic skin crusting there No open area or discharge Hurts in bed--but walking okay on it with rolling walker and doesn't hurt then No known injury--but did fall ~1 month ago (slid out of chair)  Has red lower right eyelid Can be both eyes at times Intermittent over months Has had surgery to tighten up there--- but has lower lid eversion  Will feel depressed once in a while Perhaps twice a week---variable time (as much as a day) 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   No current facility-administered medications on file prior to visit.    Allergies  Allergen Reactions  . Codeine     Past Medical History  Diagnosis Date  . Cancer ([place])   . Blood transfusion without reported diagnosis   . Urinary incontinence   . Urinary tract infection   . Chicken pox   . Prostate cancer, primary, with metastasis from prostate to other site [facility])   . History of urostomy   . Mitral regurgitation   . Squamous cell skin cancer, multiple sites   . Basal cell carcinoma   . Lyme disease 2012    Past Surgical History  Procedure Laterality Date  . Tonsillectomy and adenoidectomy  1933  . Blepharoplasty Bilateral 1980  . Inguinal hernia repair Bilateral 1970  . Cystectomy w/ ureteroileal conduit N/A 11/13    [facility]  . Hip fracture surgery Left 11/15      Family History  Problem Relation Age of Onset  . Alcohol abuse Father   . Heart disease Mother   . Diabetes Neg Hx   . Hypertension Neg Hx     Social History   Social History  . Marital Status: Married    Spouse Name: N/A  . Number of Children: 4  . Years of Education: N/A   Occupational History  . Retired -- Art therapist of Social work     [employer]   Social History Main Topics  . Smoking status: Former Smoker    Quit date: 01/23/1968  . Smokeless tobacco: Never Used  . Alcohol Use: No  . Drug Use: No  . Sexual Activity: Not on file   Other Topics Concern  . Not on file   Social History Narrative   Has living will   Son Christopher Jacobson has health care POA   Has DNR order already   No tube feeds if cognitively unaware   Review of Systems  Some trouble with the urostomy---some leakage with last batch of bags Eating okay Weight up 5# Sleeps      Objective:   Physical Exam  Eyes:  Right lid is everted Inflamed without discharge No bulbar findings  Musculoskeletal:  Very sensitive over the right lateral malleolus where dry eschar is. No redness or inflammation. Totally full ROM at ankle without pain Full weight  bearing without pain  Psychiatric: He has a normal mood and affect. His behavior is normal.          Assessment & Plan:

## 2014-11-18 NOTE — Assessment & Plan Note (Signed)
Seems to be related to eversion of lid Will try lacrilube Consider eye patch if eye not completely closed with sleep May need to go back to eye doctor

## 2014-12-11 ENCOUNTER — Encounter: Payer: Self-pay | Admitting: Emergency Medicine

## 2014-12-11 ENCOUNTER — Emergency Department: Payer: Medicare Other

## 2014-12-11 ENCOUNTER — Emergency Department
Admission: EM | Admit: 2014-12-11 | Discharge: 2014-12-11 | Disposition: A | Discharge status: Home / Self Care | Payer: Medicare Other | Attending: Emergency Medicine | Admitting: Emergency Medicine

## 2014-12-11 DIAGNOSIS — Z936 Other artificial openings of urinary tract status: Secondary | ICD-10-CM | POA: Insufficient documentation

## 2014-12-11 DIAGNOSIS — R4182 Altered mental status, unspecified: Secondary | ICD-10-CM | POA: Diagnosis present

## 2014-12-11 DIAGNOSIS — Z87891 Personal history of nicotine dependence: Secondary | ICD-10-CM | POA: Diagnosis not present

## 2014-12-11 DIAGNOSIS — N390 Urinary tract infection, site not specified: Secondary | ICD-10-CM | POA: Diagnosis not present

## 2014-12-11 DIAGNOSIS — R55 Syncope and collapse: Secondary | ICD-10-CM | POA: Diagnosis not present

## 2014-12-11 LAB — CBC WITH DIFFERENTIAL/PLATELET
BASOS ABS: 0.1 10*3/uL (ref 0–0.1)
BASOS PCT: 1 %
EOS ABS: 0.2 10*3/uL (ref 0–0.7)
Eosinophils Relative: 2 %
HCT: 38.4 % — ABNORMAL LOW (ref 40.0–52.0)
Hemoglobin: 12.7 g/dL — ABNORMAL LOW (ref 13.0–18.0)
Lymphocytes Relative: 27 %
Lymphs Abs: 2.7 10*3/uL (ref 1.0–3.6)
MCH: 32.3 pg (ref 26.0–34.0)
MCHC: 33.2 g/dL (ref 32.0–36.0)
MCV: 97.3 fL (ref 80.0–100.0)
MONO ABS: 0.8 10*3/uL (ref 0.2–1.0)
MONOS PCT: 8 %
Neutro Abs: 6.1 10*3/uL (ref 1.4–6.5)
Neutrophils Relative %: 62 %
PLATELETS: 193 10*3/uL (ref 150–440)
RBC: 3.94 MIL/uL — ABNORMAL LOW (ref 4.40–5.90)
RDW: 14.8 % — AB (ref 11.5–14.5)
WBC: 9.9 10*3/uL (ref 3.8–10.6)

## 2014-12-11 LAB — COMPREHENSIVE METABOLIC PANEL
ALBUMIN: 3.6 g/dL (ref 3.5–5.0)
ALK PHOS: 70 U/L (ref 38–126)
ALT: 19 U/L (ref 17–63)
ANION GAP: 8 (ref 5–15)
AST: 29 U/L (ref 15–41)
BILIRUBIN TOTAL: 0.7 mg/dL (ref 0.3–1.2)
BUN: 23 mg/dL — AB (ref 6–20)
CHLORIDE: 105 mmol/L (ref 101–111)
CO2: 26 mmol/L (ref 22–32)
Calcium: 9.2 mg/dL (ref 8.9–10.3)
Creatinine, Ser: 1.31 mg/dL — ABNORMAL HIGH (ref 0.61–1.24)
GFR calc Af Amer: 54 mL/min — ABNORMAL LOW (ref >60)
GFR calc non Af Amer: 47 mL/min — ABNORMAL LOW (ref >60)
GLUCOSE: 122 mg/dL — AB (ref 65–99)
POTASSIUM: 4.3 mmol/L (ref 3.5–5.1)
Sodium: 139 mmol/L (ref 135–145)
TOTAL PROTEIN: 7 g/dL (ref 6.5–8.1)

## 2014-12-11 LAB — URINALYSIS COMPLETE WITH MICROSCOPIC (ARMC ONLY)
BILIRUBIN URINE: NEGATIVE
Bacteria, UA: NONE SEEN
Glucose, UA: NEGATIVE mg/dL
KETONES UR: NEGATIVE mg/dL
LEUKOCYTES UA: NEGATIVE
Nitrite: NEGATIVE
PH: 8 (ref 5.0–8.0)
PROTEIN: 30 mg/dL — AB
Specific Gravity, Urine: 1.013 (ref 1.005–1.030)

## 2014-12-11 LAB — TROPONIN I: Troponin I: <0.03 ng/mL (ref <0.031)

## 2014-12-11 MED ORDER — SODIUM CHLORIDE 0.9 % IV BOLUS (SEPSIS)
500.0000 mL | Freq: Once | INTRAVENOUS | Status: AC
  Administered 2014-12-11: 500 mL via INTRAVENOUS

## 2014-12-11 MED ORDER — CIPROFLOXACIN HCL 500 MG PO TABS: 500.0000 mg | ORAL_TABLET | Freq: Two times a day (BID) | ORAL | Status: DC

## 2014-12-11 MED ORDER — CIPROFLOXACIN HCL 500 MG PO TABS
500.0000 mg | ORAL_TABLET | Freq: Once | ORAL | Status: AC
  Administered 2014-12-11: 500 mg via ORAL
  Filled 2014-12-11: qty 1

## 2014-12-11 NOTE — ED Notes (Signed)
Pts son came to the desk, stated that his dad thinks that around 0800 this morning he became weak on is right side, started leaning to the right, was in the kitchen at the time. His son states that the "leaning to the side" is what usually makes the pt feel weak/weary, walked to the bathroom, sat down and had a bowel movement which usually makes him feel very "washed out" per pt and son, then EMS was called by pts wife because pt was unresponsive on commode.

## 2014-12-11 NOTE — ED Notes (Signed)
Pt more aware, able to state name and knows who son is upon arrival. Pts son states he does have dementia as well.

## 2014-12-11 NOTE — ED Notes (Signed)
Urine obtained from clean urostomy bag.

## 2014-12-11 NOTE — ED Notes (Signed)
Food service notified that pt needs heart healthy diet tray, states will deliver.

## 2014-12-11 NOTE — ED Notes (Signed)
Upon clarification with pt, only his right leg became weak in the kitchen while he was leaning on his right side.

## 2014-12-11 NOTE — ED Notes (Signed)
Waiting for family member to bring in new urostomy bag for urine collection.

## 2014-12-11 NOTE — ED Notes (Signed)
Pts son at bedside, states the confusion is very normal when his dad has a UTI.

## 2014-12-11 NOTE — ED Notes (Signed)
New urostomy bag applied per protocol, son at bedside to assist. Pt tolerated well. Will check back for urine collection.

## 2014-12-11 NOTE — ED Notes (Signed)
Per pts son, "mucus" in urostomy is normal for pt. Also, very foul smelling urine in urostomy/and this is normal.

## 2014-12-11 NOTE — ED Provider Notes (Signed)
[facility] Emergency Department Provider Note  ____________________________________________  Time seen: Approximately 1130 AM  I have reviewed the triage vital signs and the nursing notes.   HISTORY  Chief Complaint Altered Mental Status    HPI Christopher Jacobson is a 81 y.o. male with a history of prostate cancer with a urostomy was presenting today with a syncopal episode. Per the patient's son the patient was found sitting on the toilet this morning by his wife. It is unknown how long he was sitting there. The patient is not able to recall. The patient is at his baseline mental status at this time. The son says that he has had a cognitive decline over the past 6-8 months but this time he is a no 3 and the son says that he is acting normally for him. The son says that he has passed out about 3 times total at this point. The other times were all while the patient was in bed. The patient denies any chest pain, shortness of breath at this time but does not remember the events leading up to him passing out. Per the son he has been eating and drinking normally as of late. His urostomy is cloudy with some sediment and mucus inside. The son says that this is how it looks at baseline. The son says it also has a strong foul odor in his baseline as well. Per the son the patient was confused initially but at this point has returned to normal. He says that the other times when he had episodes like this that it took multiple much longer time to return to his normal baseline mental status.   Past Medical History  Diagnosis Date  . Cancer ([place])   . Blood transfusion without reported diagnosis   . Urinary incontinence   . Urinary tract infection   . Chicken pox   . Prostate cancer, primary, with metastasis from prostate to other site [facility])   . History of urostomy   . Mitral regurgitation   . Squamous cell skin cancer, multiple sites   . Basal cell carcinoma   . Lyme disease  2012    Patient Active Problem List   Diagnosis Date Noted  . Right ankle pain 11/18/2014  . Blepharitis of right lower eyelid 11/18/2014  . Major depressive disorder, single episode, moderate ([place]) 09/07/2013  . Prostate cancer, primary, with metastasis from prostate to other site [facility])   . History of urostomy   . Mitral regurgitation     Past Surgical History  Procedure Laterality Date  . Tonsillectomy and adenoidectomy  1933  . Blepharoplasty Bilateral 1980  . Inguinal hernia repair Bilateral 1970  . Cystectomy w/ ureteroileal conduit N/A 11/13    [facility]  . Hip fracture surgery Left 11/15    Current Outpatient Rx  Name  Route  Sig  Dispense  Refill  . artificial tears (LACRILUBE) OINT ophthalmic ointment   Right Eye   Place into the right eye every 3 (three) hours as needed for dry eyes.   3.5 g   10     Allergies Codeine  Family History  Problem Relation Age of Onset  . Alcohol abuse Father   . Heart disease Mother   . Diabetes Neg Hx   . Hypertension Neg Hx     Social History Social History  Substance Use Topics  . Smoking status: Former Smoker    Quit date: 01/23/1968  . Smokeless tobacco: Never Used  . Alcohol Use: No  Review of Systems Constitutional: No fever/chills Eyes: No visual changes. ENT: No sore throat. Cardiovascular: Denies chest pain. Respiratory: Denies shortness of breath. Gastrointestinal: No abdominal pain.  No nausea, no vomiting.  No diarrhea.  No constipation. Genitourinary: Negative for dysuria. Musculoskeletal: Negative for back pain. Skin: Negative for rash. Neurological: Negative for headaches, focal weakness or numbness.  10-point ROS otherwise negative.  ____________________________________________   PHYSICAL EXAM:  VITAL SIGNS: ED Triage Vitals  Enc Vitals Group     BP 12/11/14 1033 108/61 mmHg     Pulse Rate 12/11/14 1033 74     Resp 12/11/14 1033 18     Temp 12/11/14 1035 97.5 F (36.4 C)     Temp  Source 12/11/14 1035 Oral     SpO2 12/11/14 1033 94 %     Weight 12/11/14 1033 155 lb (70.308 kg)     Height 12/11/14 1033 5\' 9"  (1.753 m)     Head Cir --      Peak Flow --      Pain Score --      Pain Loc --      Pain Edu? --      Excl. in [place]? --     Constitutional: Alert and oriented. Well appearing and in no acute distress. Eyes: Conjunctivae are normal. PERRL. EOMI. Head: Atraumatic. Nose: No congestion/rhinnorhea. Mouth/Throat: Mucous membranes are moist.  Oropharynx non-erythematous. Neck: No stridor.   Cardiovascular: Normal rate, regular rhythm. 3/6 murmer which the son says is known.  Good peripheral circulation. Respiratory: Normal respiratory effort.  No retractions. Lungs CTAB. Gastrointestinal: Soft and nontender. No distention. No abdominal bruits. No CVA tenderness. Right-sided urostomy. Clotting urine with mucus in it. Stoma is intact without any spreading erythema or induration. Musculoskeletal: No lower extremity tenderness nor edema.  No joint effusions. Neurologic:  Normal speech and language. No gross focal neurologic deficits are appreciated.  Skin:  Skin is warm, dry and intact. No rash noted. Psychiatric: Mood and affect are normal. Speech and behavior are normal.  ____________________________________________   LABS (all labs ordered are listed, but only abnormal results are displayed)  Labs Reviewed  COMPREHENSIVE METABOLIC PANEL - Abnormal; Notable for the following:    Glucose, Bld 122 (*)    BUN 23 (*)    Creatinine, Ser 1.31 (*)    GFR calc non Af Amer 47 (*)    GFR calc Af Amer 54 (*)    All other components within normal limits  URINALYSIS COMPLETEWITH MICROSCOPIC ([facility] ONLY) - Abnormal; Notable for the following:    Color, Urine YELLOW (*)    APPearance HAZY (*)    Hgb urine dipstick 1+ (*)    Protein, ur 30 (*)    Squamous Epithelial / LPF 0-5 (*)    All other components within normal limits  CBC WITH DIFFERENTIAL/PLATELET - Abnormal;  Notable for the following:    RBC 3.94 (*)    Hemoglobin 12.7 (*)    HCT 38.4 (*)    RDW 14.8 (*)    All other components within normal limits  URINE CULTURE  TROPONIN I  TROPONIN I   ____________________________________________  EKG  ED ECG REPORT I, Doran Stabler, the attending physician, personally viewed and interpreted this ECG.   Date: 12/11/2014  EKG Time: 1029  Rate: 73  Rhythm: normal sinus rhythm  Axis: Normal axis  Intervals:none  ST&T Change: No ST elevations or depressions. Single T-wave inversion in lead aVL.  Nose no gait change from  03/17/2014.   ____________________________________________  RADIOLOGY  No acute abnormality found on the CT the brain. Chest x-ray with small right pleural effusion. ____________________________________________   PROCEDURES   ____________________________________________   INITIAL IMPRESSION / ASSESSMENT AND PLAN / ED COURSE  Pertinent labs & imaging results that were available during my care of the patient were reviewed by me and considered in my medical decision making (see chart for details).  ----------------------------------------- 3:58 PM on 12/11/2014 -----------------------------------------  Patient continues to be at his baseline mental status. He denies any pain at this time. Urine has a positive UA but the patient has a urostomy. However, he has had multiple episodes of this in the past that have ended up resulting in urinary tract infection. We'll treat with Cipro which he has tolerated before. Son also says that the patient has been eating and drinking enough lately. Possible mild dehydration. We'll give fluids. Also waiting for second troponin. If continues to have normal vital signs and second troponin is negative will discharge to home. Signed out to Dr. Kerman Passey.  Possible syncopal episode secondary to dehydration versus micturition syncope versus vagal while moving his bowels. Less likely  cardiac episode. ____________________________________________   FINAL CLINICAL IMPRESSION(S) / ED DIAGNOSES  Syncope. UTI.    Orbie Pyo, MD 12/11/14 1600

## 2014-12-11 NOTE — ED Notes (Signed)
Pt here via EMS, was found "passed out" on the toilet per pts son. Pt very confused, upon initial ER assessment, pt was unable to state his name, place or time. Pt now states his name but does not know why he is here. Son states pt has had a few UTI's and presents with confusion like today. Pt has a urostomy bag intact, stoma pink. Blood glucose per EMS was 140. Pt denies pain.

## 2014-12-11 NOTE — ED Provider Notes (Signed)
-----------------------------------------   4:44 PM on 12/11/2014 -----------------------------------------  Patient care assumed from Dr. Clearnce Hasten. Patient's repeat troponin is negative. We will discharge the patient with primary care follow-up per Dr. Clearnce Hasten instructions. I discussed this with the patient as well as the patient's family and they are agreeable. Patient will be discharged on an antibiotic for his likely urinary tract infection. A urine culture has been sent.  Harvest Dark, MD 12/11/14 [id number]

## 2014-12-11 NOTE — ED Notes (Signed)
Son states pt is under care at [facility] for prostate ca.

## 2014-12-13 ENCOUNTER — Telehealth: Payer: Self-pay | Admitting: *Deleted

## 2014-12-13 ENCOUNTER — Other Ambulatory Visit (INDEPENDENT_AMBULATORY_CARE_PROVIDER_SITE_OTHER): Payer: Medicare Other

## 2014-12-13 DIAGNOSIS — C61 Malignant neoplasm of prostate: Secondary | ICD-10-CM | POA: Diagnosis not present

## 2014-12-13 LAB — COMPREHENSIVE METABOLIC PANEL
ALBUMIN: 4.1 g/dL (ref 3.5–5.2)
ALK PHOS: 82 U/L (ref 39–117)
ALT: 18 U/L (ref 0–53)
AST: 26 U/L (ref 0–37)
BUN: 27 mg/dL — AB (ref 6–23)
CALCIUM: 9.4 mg/dL (ref 8.4–10.5)
CO2: 26 mEq/L (ref 19–32)
CREATININE: 1.22 mg/dL (ref 0.40–1.50)
Chloride: 102 mEq/L (ref 96–112)
GFR: 59.48 mL/min — ABNORMAL LOW (ref >60.00)
Glucose, Bld: 82 mg/dL (ref 70–99)
POTASSIUM: 4.2 meq/L (ref 3.5–5.1)
SODIUM: 138 meq/L (ref 135–145)
TOTAL PROTEIN: 7.5 g/dL (ref 6.0–8.3)
Total Bilirubin: 0.7 mg/dL (ref 0.2–1.2)

## 2014-12-13 LAB — PSA: PSA: 76.87 ng/mL — AB (ref 0.10–4.00)

## 2014-12-13 LAB — URINE CULTURE

## 2014-12-13 NOTE — Telephone Encounter (Signed)
Spoke with patient's wife and she states pt is doing well, he was gone to have blood work done with his son. She states she medication is helping.

## 2014-12-14 LAB — CBC WITH DIFFERENTIAL/PLATELET
Basophils Absolute: 0 10*3/uL (ref 0.0–0.1)
Basophils Relative: 0.3 % (ref 0.0–3.0)
EOS ABS: 0.3 10*3/uL (ref 0.0–0.7)
EOS PCT: 3.6 % (ref 0.0–5.0)
HCT: 42.5 % (ref 39.0–52.0)
HEMOGLOBIN: 14.1 g/dL (ref 13.0–17.0)
LYMPHS PCT: 12.4 % (ref 12.0–46.0)
Lymphs Abs: 1 10*3/uL (ref 0.7–4.0)
MCHC: 33.3 g/dL (ref 30.0–36.0)
MCV: 97.2 fl (ref 78.0–100.0)
MONO ABS: 0.6 10*3/uL (ref 0.1–1.0)
Monocytes Relative: 7.2 % (ref 3.0–12.0)
Neutro Abs: 6.2 10*3/uL (ref 1.4–7.7)
Neutrophils Relative %: 76.5 % (ref 43.0–77.0)
Platelets: 253 10*3/uL (ref 150.0–400.0)
RBC: 4.37 Mil/uL (ref 4.22–5.81)
RDW: 15.4 % (ref 11.5–15.5)
WBC: 8.1 10*3/uL (ref 4.0–10.5)

## 2014-12-24 ENCOUNTER — Telehealth: Payer: Self-pay | Admitting: Internal Medicine

## 2014-12-24 DIAGNOSIS — C61 Malignant neoplasm of prostate: Secondary | ICD-10-CM

## 2014-12-24 NOTE — Telephone Encounter (Signed)
Ginny at [place] office called and would like patient to have patient's PSA checked at our office on 02/03/15 and 03/15/15.  Patient's son will call to schedule appointments.

## 2014-12-25 NOTE — Addendum Note (Signed)
Addended by: Viviana Simpler I on: 12/25/2014 08:24 AM   Modules accepted: Orders

## 2014-12-25 NOTE — Telephone Encounter (Signed)
Order placed for when he comes in

## 2015-01-20 ENCOUNTER — Encounter: Payer: Self-pay | Admitting: *Deleted

## 2015-02-03 ENCOUNTER — Other Ambulatory Visit (INDEPENDENT_AMBULATORY_CARE_PROVIDER_SITE_OTHER): Payer: Medicare Other

## 2015-02-03 DIAGNOSIS — C61 Malignant neoplasm of prostate: Secondary | ICD-10-CM | POA: Diagnosis not present

## 2015-02-03 LAB — PSA: PSA: 422.66 ng/mL — ABNORMAL HIGH (ref 0.10–4.00)

## 2015-03-15 ENCOUNTER — Other Ambulatory Visit (INDEPENDENT_AMBULATORY_CARE_PROVIDER_SITE_OTHER): Payer: Medicare Other

## 2015-03-15 DIAGNOSIS — C61 Malignant neoplasm of prostate: Secondary | ICD-10-CM | POA: Diagnosis not present

## 2015-03-15 DIAGNOSIS — Z125 Encounter for screening for malignant neoplasm of prostate: Secondary | ICD-10-CM

## 2015-03-16 LAB — PSA, MEDICARE

## 2015-03-25 ENCOUNTER — Telehealth: Payer: Self-pay | Admitting: Internal Medicine

## 2015-03-25 ENCOUNTER — Other Ambulatory Visit: Payer: Self-pay | Admitting: Internal Medicine

## 2015-03-25 DIAGNOSIS — C61 Malignant neoplasm of prostate: Secondary | ICD-10-CM

## 2015-03-25 DIAGNOSIS — C7951 Secondary malignant neoplasm of bone: Principal | ICD-10-CM

## 2015-03-25 NOTE — Telephone Encounter (Signed)
Okay to schedule Please set him up Dx-- metastatic prostate cancer

## 2015-03-25 NOTE — Telephone Encounter (Signed)
Labs ordered, appt made

## 2015-03-25 NOTE — Telephone Encounter (Signed)
Christopher Jacobson pt son called wanting to schedule order for dr Mina Marble.   Dr Mina Marble wants cbc with diff Basic chem    Chem 6 or 7 panel with creanatinin  Is it ok to schedule  Christopher Jacobson stated dr Mina Marble told them his prostate cancer has moved to his bones

## 2015-03-29 ENCOUNTER — Other Ambulatory Visit (INDEPENDENT_AMBULATORY_CARE_PROVIDER_SITE_OTHER): Payer: Medicare Other

## 2015-03-29 DIAGNOSIS — C7951 Secondary malignant neoplasm of bone: Secondary | ICD-10-CM

## 2015-03-29 DIAGNOSIS — C61 Malignant neoplasm of prostate: Secondary | ICD-10-CM

## 2015-03-29 LAB — BASIC METABOLIC PANEL
BUN: 24 mg/dL — AB (ref 6–23)
CO2: 25 mEq/L (ref 19–32)
CREATININE: 1.15 mg/dL (ref 0.40–1.50)
Calcium: 9.3 mg/dL (ref 8.4–10.5)
Chloride: 104 mEq/L (ref 96–112)
GFR: 63.63 mL/min (ref >60.00)
Glucose, Bld: 81 mg/dL (ref 70–99)
POTASSIUM: 4.4 meq/L (ref 3.5–5.1)
Sodium: 139 mEq/L (ref 135–145)

## 2015-03-29 LAB — CBC WITH DIFFERENTIAL/PLATELET
BASOS ABS: 0 10*3/uL (ref 0.0–0.1)
Basophils Relative: 0.4 % (ref 0.0–3.0)
EOS ABS: 0.1 10*3/uL (ref 0.0–0.7)
Eosinophils Relative: 1.7 % (ref 0.0–5.0)
HEMATOCRIT: 41.8 % (ref 39.0–52.0)
HEMOGLOBIN: 13.9 g/dL (ref 13.0–17.0)
LYMPHS PCT: 26.1 % (ref 12.0–46.0)
Lymphs Abs: 2.2 10*3/uL (ref 0.7–4.0)
MCHC: 33.2 g/dL (ref 30.0–36.0)
MCV: 96.1 fl (ref 78.0–100.0)
MONO ABS: 0.9 10*3/uL (ref 0.1–1.0)
Monocytes Relative: 10.9 % (ref 3.0–12.0)
Neutro Abs: 5 10*3/uL (ref 1.4–7.7)
Neutrophils Relative %: 60.9 % (ref 43.0–77.0)
Platelets: 214 10*3/uL (ref 150.0–400.0)
RBC: 4.35 Mil/uL (ref 4.22–5.81)
RDW: 16.5 % — ABNORMAL HIGH (ref 11.5–15.5)
WBC: 8.2 10*3/uL (ref 4.0–10.5)

## 2015-05-23 ENCOUNTER — Telehealth: Payer: Self-pay | Admitting: Internal Medicine

## 2015-05-23 NOTE — Telephone Encounter (Signed)
She just arrived at the house. He has met bone cancer. Declined rapidly over the weekend. Needs a lot of help to get things done. Not complaining of a lot of pain. They feel he is definitely a hospice candidate. They thought his PCP should probably do the orders instead of oncology and follow the patient. He has a DNR already.

## 2015-05-23 NOTE — Telephone Encounter (Signed)
He was last seen here in October. Hospice was just reaching out to [place]. If Dr Silvio Pate thinks Dr Patrick Jupiter from [facility] should do it, let Hospice know. The family said it is really hard to get him out of the house.

## 2015-05-23 NOTE — Telephone Encounter (Signed)
I can be the hospice attending I will also plan to do a home visit on him within the next week or so

## 2015-05-23 NOTE — Telephone Encounter (Signed)
Find out what hospice I want him to use [facility] have a nurse at [facility] every day

## 2015-05-23 NOTE — Telephone Encounter (Signed)
Social worker from hospice has called regarding pt.   She has questions regarding a hospice order. re cb number is [employer] [phone] Fax [phone]

## 2015-05-23 NOTE — Telephone Encounter (Signed)
Spoke to [employer]. She asked if we can fax an order to her and she will start the referral tomorrow. She said thank you very much!!

## 2015-05-24 NOTE — Telephone Encounter (Signed)
I faxed patient's demographics,05/13/14 and 11/18/14 office notes and 02/03/15 and 03/15/15 lab reports to Sarah Ann of [place].

## 2015-05-24 NOTE — Telephone Encounter (Signed)
Referral made to [facility] [place]  Please send demographics and last couple of my notes Also last couple of PSA values

## 2015-05-24 NOTE — Telephone Encounter (Signed)
Spoke to [employer], she is the clinical Education officer, museum at [facility] and is mainly over [place] living and [employer]. She said she works at [employer], but the family does want to use [facility]. We need to send the referral to Hospice and she will talk to hospice and let them know everything she has on the patient.

## 2015-06-02 ENCOUNTER — Encounter: Payer: Self-pay | Admitting: Internal Medicine

## 2015-06-02 ENCOUNTER — Ambulatory Visit: Payer: Medicare Other | Admitting: Internal Medicine

## 2015-06-02 VITALS — BP 92/50 | HR 78 | Resp 18

## 2015-06-02 DIAGNOSIS — I340 Nonrheumatic mitral (valve) insufficiency: Secondary | ICD-10-CM | POA: Diagnosis not present

## 2015-06-02 DIAGNOSIS — E441 Mild protein-calorie malnutrition: Secondary | ICD-10-CM | POA: Diagnosis not present

## 2015-06-02 DIAGNOSIS — Z936 Other artificial openings of urinary tract status: Secondary | ICD-10-CM

## 2015-06-02 DIAGNOSIS — F39 Unspecified mood [affective] disorder: Secondary | ICD-10-CM

## 2015-06-02 DIAGNOSIS — C7951 Secondary malignant neoplasm of bone: Secondary | ICD-10-CM | POA: Diagnosis not present

## 2015-06-02 DIAGNOSIS — F321 Major depressive disorder, single episode, moderate: Secondary | ICD-10-CM | POA: Diagnosis not present

## 2015-06-02 DIAGNOSIS — C61 Malignant neoplasm of prostate: Secondary | ICD-10-CM

## 2015-06-02 DIAGNOSIS — C7989 Secondary malignant neoplasm of other specified sites: Secondary | ICD-10-CM

## 2015-06-02 NOTE — Assessment & Plan Note (Signed)
MDD from past seems better Off meds now Seems to have reached some acceptance

## 2015-06-02 NOTE — Progress Notes (Signed)
Subjective:    Patient ID: Christopher Jacobson, male    DOB: [birth year], 82 y.o.   MRN: [id number]  HPI Initial home visit Wife is here Now on hospice  Reviewed status with Rusty RN  PSA markedly increased Decided against the [facility] again or other Rx No longer able to walk--very weak. Transfers via rollator from bed to [place] aides 3 times a week (?) for help with bathing Has bid aides via home health  Still has urostomy bag No problems with this Will sit on commode with aides--usually successful for his bowels  No pain Doesn't feel depressed now No meds anymore--stopped the SSRI He feels "I have changed my attitude about illness"  No current outpatient prescriptions on file prior to visit.   No current facility-administered medications on file prior to visit.    Allergies  Allergen Reactions  . Codeine     Past Medical History  Diagnosis Date  . Cancer ([place])   . Blood transfusion without reported diagnosis   . Urinary incontinence   . Urinary tract infection   . Chicken pox   . Prostate cancer, primary, with metastasis from prostate to other site [facility])   . History of urostomy   . Mitral regurgitation   . Squamous cell skin cancer, multiple sites   . Basal cell carcinoma   . Lyme disease 2012    Past Surgical History  Procedure Laterality Date  . Tonsillectomy and adenoidectomy  1933  . Blepharoplasty Bilateral 1980  . Inguinal hernia repair Bilateral 1970  . Cystectomy w/ ureteroileal conduit N/A 11/13    [facility]  . Hip fracture surgery Left 11/15    Family History  Problem Relation Age of Onset  . Alcohol abuse Father   . Heart disease Mother   . Diabetes Neg Hx   . Hypertension Neg Hx     Social History   Social History  . Marital Status: Married    Spouse Name: N/A  . Number of Children: 4  . Years of Education: N/A   Occupational History  . Retired -- Art therapist of Social work     [employer]   Social History Main Topics  . Smoking  status: Former Smoker    Quit date: 01/23/1968  . Smokeless tobacco: Never Used  . Alcohol Use: No  . Drug Use: No  . Sexual Activity: Not on file   Other Topics Concern  . Not on file   Social History Narrative   Has living will   Son Legrand Com[place] has health care POA   Has DNR order already   No tube feeds if cognitively unaware   Review of Systems No N/V Appetite poor--has lost weight (but not sure how much) Daily lunch from [employer] Not excited about supplements--- discussed carnation instant breakfast with whole milk HOH Sleeps great! No SOB     Objective:   Physical Exam  Constitutional: No distress.  Neck: Normal range of motion. No thyromegaly present.  Cardiovascular: Normal rate and regular rhythm.  Exam reveals no gallop.   Loud mitral systolic murmur  Pulmonary/Chest: Effort normal. No respiratory distress. He has no wheezes.  ??slight bibasilar dry crackles  Abdominal: Soft. There is no tenderness.  Urostomy in place  Musculoskeletal: He exhibits no edema.  Lymphadenopathy:    He has no cervical adenopathy.  Skin: No rash noted.  No ulcers  Psychiatric: He has a normal mood and affect. His behavior is normal.  Assessment & Plan:

## 2015-06-02 NOTE — Assessment & Plan Note (Signed)
No CHF No action

## 2015-06-02 NOTE — Assessment & Plan Note (Signed)
Has decided against other Rx Fortunately no pain Now on hospice

## 2015-06-02 NOTE — Assessment & Plan Note (Signed)
Asked them to try carnation instant breakfast with whole milk--in between meals

## 2015-06-02 NOTE — Assessment & Plan Note (Signed)
No problems with this at present

## 2015-06-13 ENCOUNTER — Ambulatory Visit: Payer: Medicare Other | Admitting: Internal Medicine

## 2015-06-16 ENCOUNTER — Encounter: Payer: Medicare Other | Admitting: Internal Medicine

## 2015-06-21 DIAGNOSIS — C61 Malignant neoplasm of prostate: Secondary | ICD-10-CM | POA: Diagnosis not present

## 2015-06-21 DIAGNOSIS — E441 Mild protein-calorie malnutrition: Secondary | ICD-10-CM | POA: Diagnosis not present

## 2015-06-21 DIAGNOSIS — Z936 Other artificial openings of urinary tract status: Secondary | ICD-10-CM | POA: Diagnosis not present

## 2015-06-21 DIAGNOSIS — D692 Other nonthrombocytopenic purpura: Secondary | ICD-10-CM | POA: Diagnosis not present

## 2015-06-21 DIAGNOSIS — F39 Unspecified mood [affective] disorder: Secondary | ICD-10-CM

## 2015-07-11 ENCOUNTER — Telehealth: Payer: Self-pay

## 2015-07-11 DIAGNOSIS — Z936 Other artificial openings of urinary tract status: Secondary | ICD-10-CM

## 2015-07-11 DIAGNOSIS — F39 Unspecified mood [affective] disorder: Secondary | ICD-10-CM

## 2015-07-11 DIAGNOSIS — F0150 Vascular dementia without behavioral disturbance: Secondary | ICD-10-CM

## 2015-07-11 DIAGNOSIS — C61 Malignant neoplasm of prostate: Secondary | ICD-10-CM | POA: Diagnosis not present

## 2015-07-11 DIAGNOSIS — E441 Mild protein-calorie malnutrition: Secondary | ICD-10-CM

## 2015-07-11 NOTE — Telephone Encounter (Signed)
PLEASE NOTE: All timestamps contained within this report are represented as [place] Standard Time. CONFIDENTIALTY NOTICE: This fax transmission is intended only for the addressee. It contains information that is legally privileged, confidential or otherwise protected from use or disclosure. If you are not the intended recipient, you are strictly prohibited from reviewing, disclosing, copying using or disseminating any of this information or taking any action in reliance on or regarding this information. If you have received this fax in error, please notify us immediately by telephone so that we can arrange for its return to [place]. Phone: [phone], Toll-Free: [phone], Fax: [phone] Page: 1 of 3 Call Id: [id number] [place] Night - Client >>>Contains Verbal Order - Signature Required<<< [place] Patient Name: Christopher Jacobson Gender: Unknown DOB: 15-Apr-1924 Age: 82 Y 4 M 4 D Return Phone Number: Address: City/State/Zip: [place] Client [place] Night - Client Client Site [place] Physician Viviana Simpler - MD Contact Type Call Who Is Calling Patient / Member / Family / Caregiver Call Type Triage / Clinical Caller Name Laureen Ochs Relationship To Patient Other Return Phone Number Please choose phone number Chief Complaint Paging or Request for Consult Reason for Call Symptomatic / Request for P[facility] from [facility] states the PT is having some burning in his eyes. The caller requests artificial tears possibly. CB# [phone] Translation No No Triage Reason Other Nurse Assessment Nurse: Richardson Landry, RN, Aldona Bar Date/Time (Eastern Time): 07/10/2015 5:14:58 PM Confirm and document reason for call. If symptomatic, describe symptoms. You must click the next button to save text entered. ---Caller is Caryl Pina nurse from  [facility], called hospice first but was told to call us instead. Caryl Pina noticed eyes really red and irritated, aide states that is normal for pt. However, tonight pt is complaining of eyes burning and pt's wife is wanting eye drops tonight. Caryl Pina is requesting artificial tears or something to help him out. Has the patient traveled out of the country within the last 30 days? ---Not Applicable Does the patient have any new or worsening symptoms? ---Yes Will a triage be completed? ---No Select reason for no triage. ---Other Guidelines Guideline Title Affirmed Question Affirmed Notes Nurse Date/Time (Isabela Time) Disp. Time Eilene Ghazi Time) Disposition Final User 07/10/2015 5:21:16 PM Paged On Call back to Call [place], Aldona Bar 07/10/2015 5:31:26 PM Call Completed Richardson Landry, RN, Aldona Bar 07/10/2015 5:31:16 PM Clinical Call Yes Richardson Landry, RN, Aldona Bar PLEASE NOTE: All timestamps contained within this report are represented as [place] Standard Time. CONFIDENTIALTY NOTICE: This fax transmission is intended only for the addressee. It contains information that is legally privileged, confidential or otherwise protected from use or disclosure. If you are not the intended recipient, you are strictly prohibited from reviewing, disclosing, copying using or disseminating any of this information or taking any action in reliance on or regarding this information. If you have received this fax in error, please notify us immediately by telephone so that we can arrange for its return to [place]. Phone: [phone], Toll-Free: [phone], Fax: [phone] Page: 2 of 3 Call Id: [id number] Verbal Orders/Maintenance Medications Medication Refill Route Dosage Regime Duration Admin Instructions User Name Artificial Tears Eye 2-3 drops As Needed instill 2-3 drops into affected eye(s) 4 times daily Richardson Landry, RN, Aldona Bar Comments User: Melba Coon, RN Date/Time Eilene Ghazi Time): 07/10/2015 5:31:05 PM RN  called RN Caryl Pina back at [facility] and gave order for artificial tears and  for pt to be evaluated tomorrow if no improvement per Dr Dimple Nanas. Paging DoctorName Phone DateTime Result/Outcome Message Type Notes Dimple Nanas - MD [id number] 07/10/2015 5:21:16 PM Paged On Call Back to Call Center Doctor Paged Please call Melba Coon, RN at [facility] at [phone] Dimple Nanas - MD 07/10/2015 5:27:40 PM Spoke with On Call - General Message Result RN gave report to on call Dr Birdie Riddle. Dr gave verbal order for artificial tears 2-3 drops into affected eye(s) qid. if no improvement have pt evaluated tomorrow. PLEASE NOTE: All timestamps contained within this report are represented as [place] Standard Time. CONFIDENTIALTY NOTICE: This fax transmission is intended only for the addressee. It contains information that is legally privileged, confidential or otherwise protected from use or disclosure. If you are not the intended recipient, you are strictly prohibited from reviewing, disclosing, copying using or disseminating any of this information or taking any action in reliance on or regarding this information. If you have received this fax in error, please notify us immediately by telephone so that we can arrange for its return to [place]. Phone: [phone], Toll-Free: [phone], Fax: [phone] Page: 3 of 3 Call Id: [id number] [place] >>>Contains Verbal Order - Signature Required<<< [address], [place] [address] [phone] Fax: [phone] [place] Night - Client [place] - Night Date: 07/10/2015 From: QI Department To: Viviana Simpler - MD Please sign the order for the approved drug(s) given by our call center nurse on your behalf. Fax to [phone] within 5 business days. Thank you. Date Eilene Ghazi Time): 07/10/2015 5:02:51 PM Triage RN:  Melba Coon, RN NAME: Lynnda Shields PHONE NUMBER: BIRTHDATE: 06-24-1924 ADDRESS: CITY/STATE/ZIP: [place] CALLER: Other NAME: Laureen Ochs Rx Given Medication Refill Route Dosage Regime Duration Admin Instructions User Name Artificial Tears Eye 2-3 drops As Needed instill 2-3 drops into affected eye(s) 4 times daily Richardson Landry, RN, Aldona Bar MD Signature Date

## 2015-07-11 NOTE — Telephone Encounter (Signed)
Seen today Has everted right lower lid Wrote for ointment for prn use

## 2015-07-29 DIAGNOSIS — F329 Major depressive disorder, single episode, unspecified: Secondary | ICD-10-CM

## 2015-08-10 DIAGNOSIS — F0150 Vascular dementia without behavioral disturbance: Secondary | ICD-10-CM | POA: Diagnosis not present

## 2015-08-10 DIAGNOSIS — C61 Malignant neoplasm of prostate: Secondary | ICD-10-CM | POA: Diagnosis not present

## 2015-08-10 DIAGNOSIS — Z936 Other artificial openings of urinary tract status: Secondary | ICD-10-CM | POA: Diagnosis not present

## 2015-08-10 DIAGNOSIS — F39 Unspecified mood [affective] disorder: Secondary | ICD-10-CM

## 2015-08-10 DIAGNOSIS — E46 Unspecified protein-calorie malnutrition: Secondary | ICD-10-CM | POA: Diagnosis not present

## 2015-08-23 DEATH — deceased

## 2016-05-08 IMAGING — CT CT HEAD W/O CM
1 series · 16 of 30 positions shown, 20 images · non-contrast
Comparison: 03/17/2014

CLINICAL DATA: Syncope today.  Right-sided weakness.

EXAM:
CT HEAD WITHOUT CONTRAST
TECHNIQUE: Contiguous axial images were obtained from the base of the skull
through the vertex without intravenous contrast.

[Series 2: head wo · axial · 0.47mm/px · z∈[-195,-51]mm · 16 of 36 slices shown, 20 images]
[im 2/36  brain]
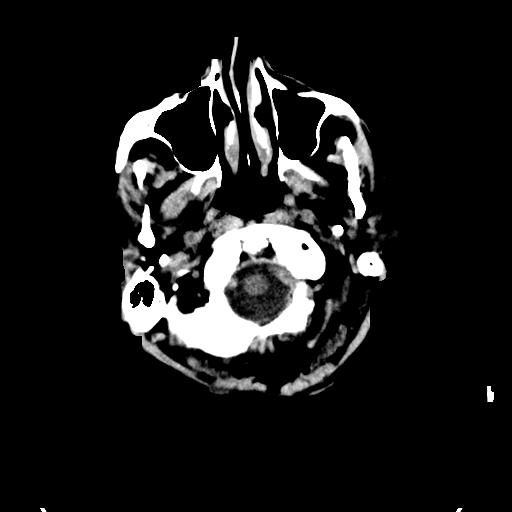
[im 2/36  bone]
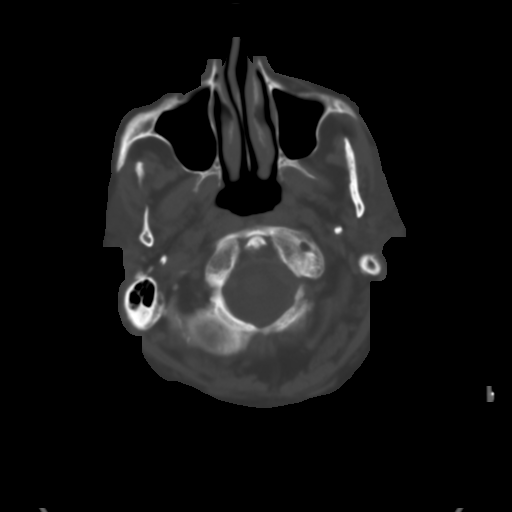
[im 4/36  brain]
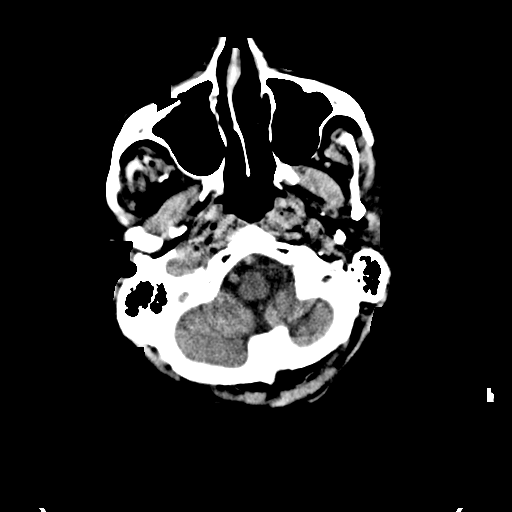
[im 7/36  brain]
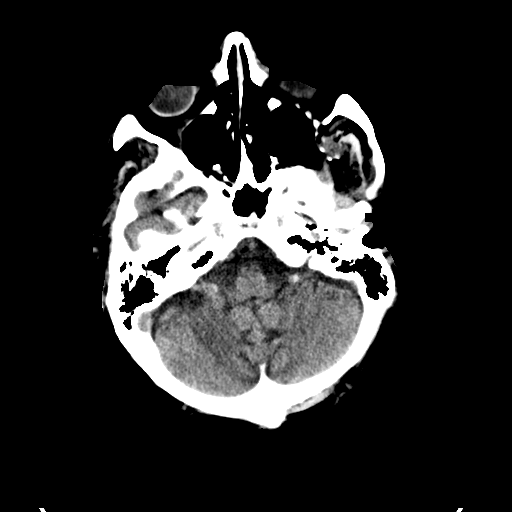
[im 9/36  brain]
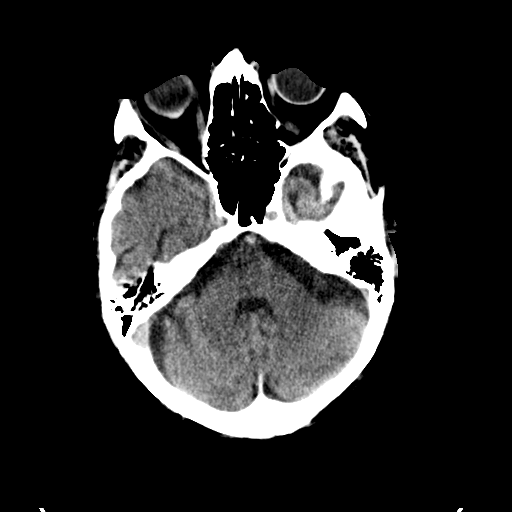
[im 10/36  brain]
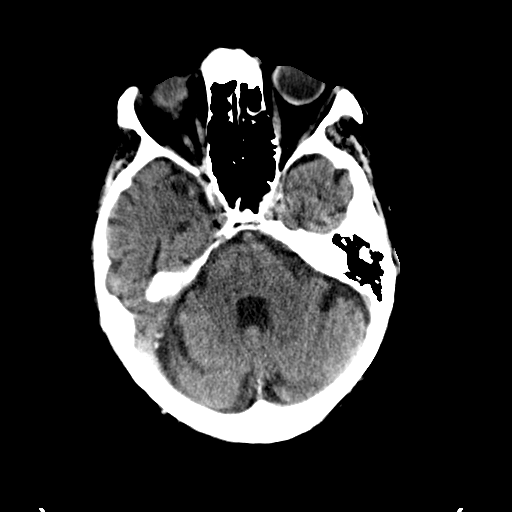
[im 10/36  bone]
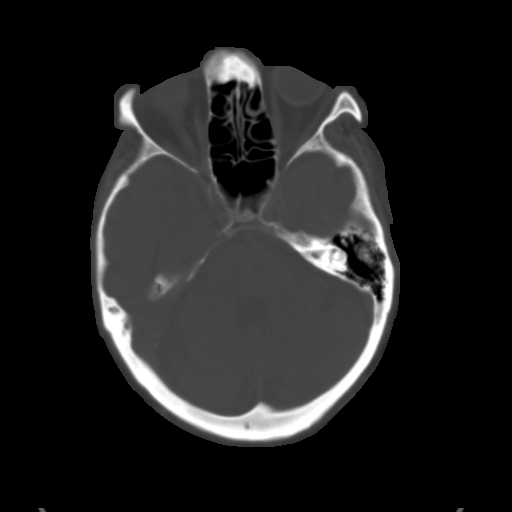
[im 13/36  brain]
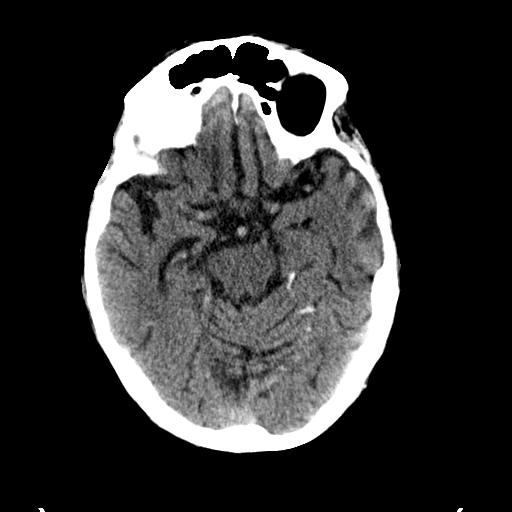
[im 15/36  brain]
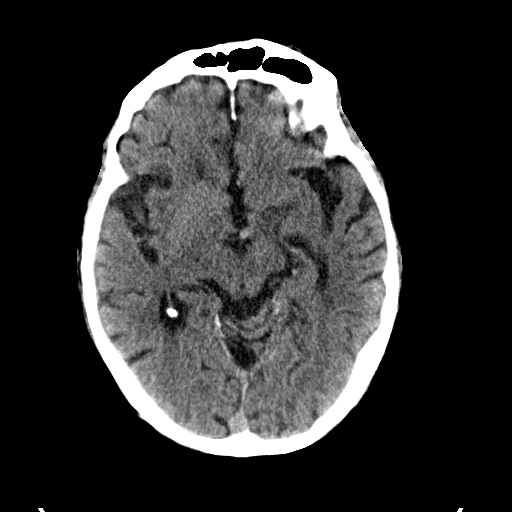
[im 17/36  brain]
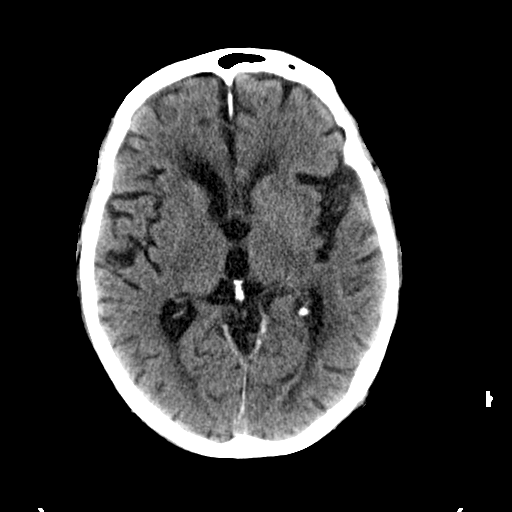
[im 19/36  brain]
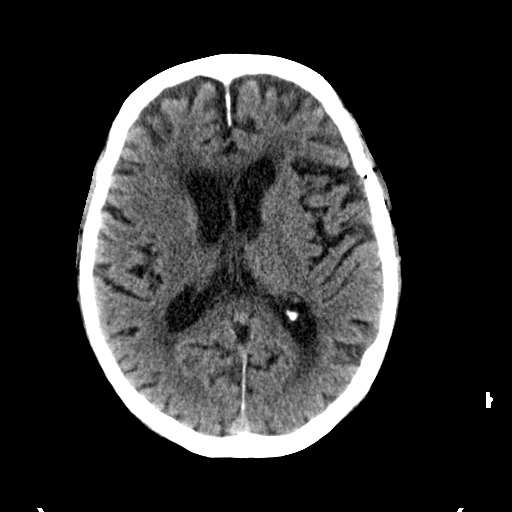
[im 19/36  bone]
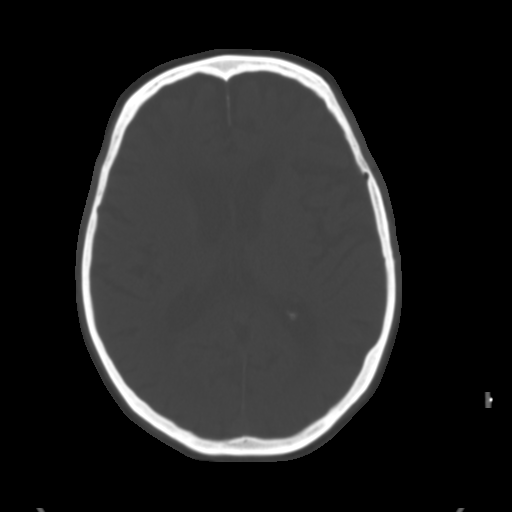
[im 21/36  brain]
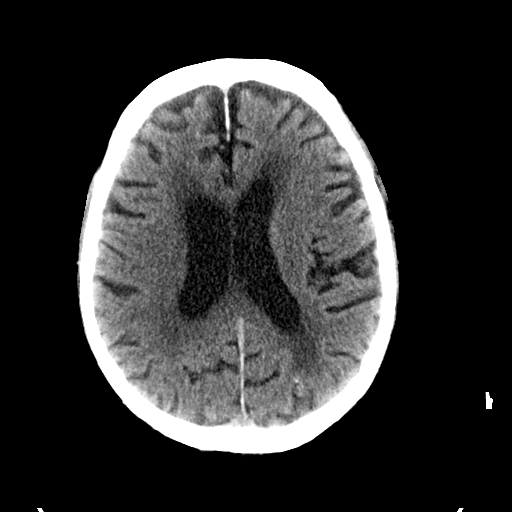
[im 23/36  brain]
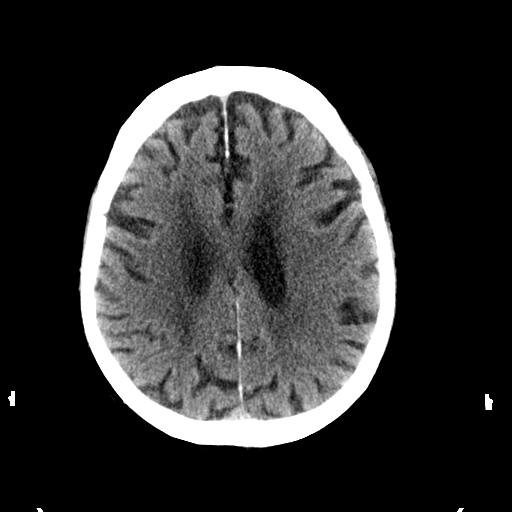
[im 26/36  brain]
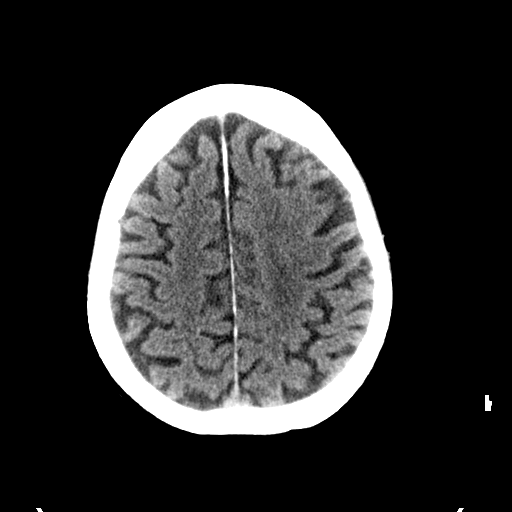
[im 27/36  brain]
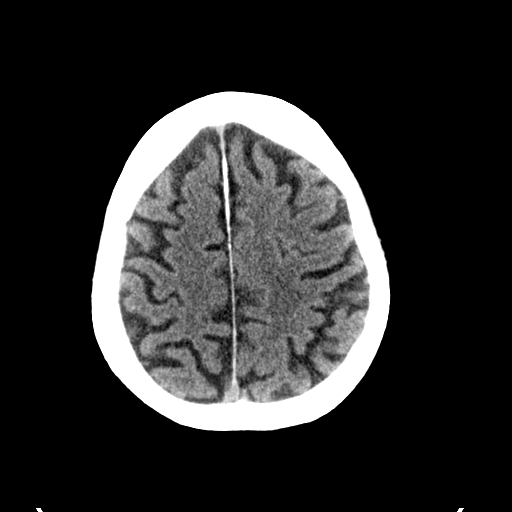
[im 27/36  bone]
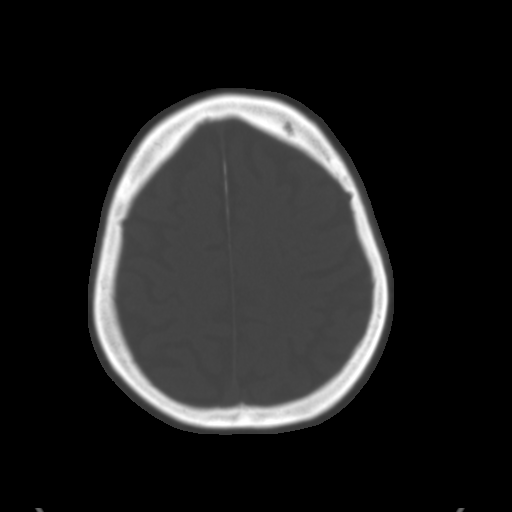
[im 29/36  brain]
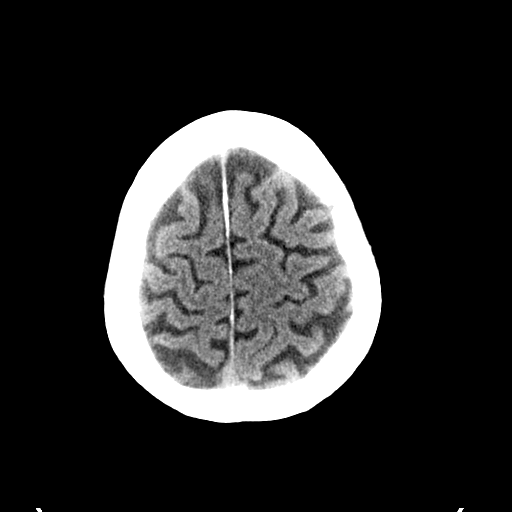
[im 32/36  brain]
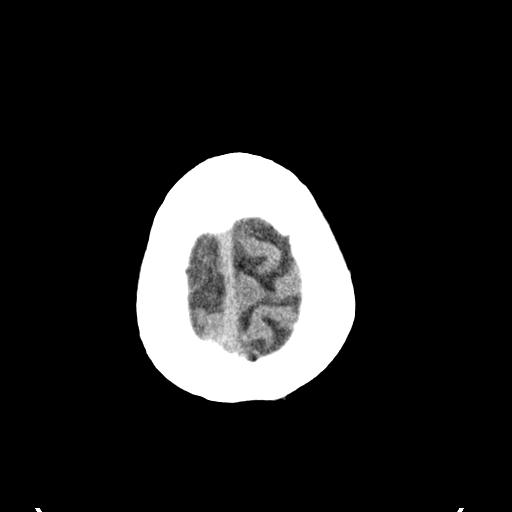
[im 34/36  brain]
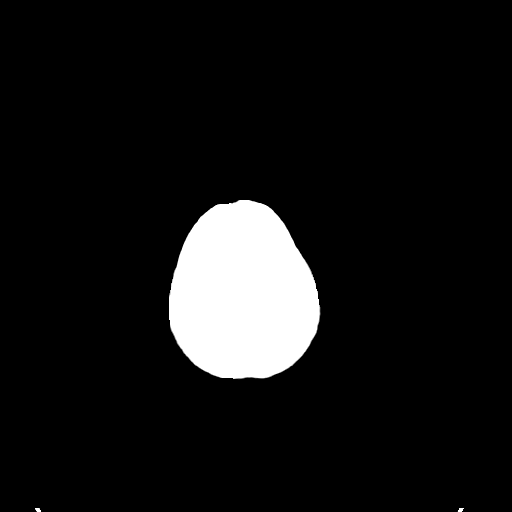

[16 of 30 positions shown; findings below may reference images not displayed]

FINDINGS: No mass lesion. No midline shift. No acute hemorrhage or hematoma.
No extra-axial fluid collections. No evidence of acute infarction.
There is diffuse slight cerebral cortical atrophy with secondary
ventricular dilatation, unchanged. No osseous abnormality.
IMPRESSION: No acute abnormality.  Diffuse atrophy.
